# Patient Record
Sex: Female | Born: 1945
Health system: Southern US, Community
[De-identification: ages and names within clinical notes are randomized; demographics above are authoritative.]

## PROBLEM LIST (undated history)

## (undated) DIAGNOSIS — F32A Depression, unspecified: Secondary | ICD-10-CM

## (undated) DIAGNOSIS — I10 Essential (primary) hypertension: Secondary | ICD-10-CM

## (undated) DIAGNOSIS — E538 Deficiency of other specified B group vitamins: Secondary | ICD-10-CM

## (undated) DIAGNOSIS — M1990 Unspecified osteoarthritis, unspecified site: Secondary | ICD-10-CM

## (undated) DIAGNOSIS — M5090 Cervical disc disorder, unspecified, unspecified cervical region: Secondary | ICD-10-CM

## (undated) DIAGNOSIS — Q2733 Arteriovenous malformation of digestive system vessel: Secondary | ICD-10-CM

## (undated) HISTORY — PX: ABDOMINAL HYSTERECTOMY: SHX81

## (undated) HISTORY — PX: COLONOSCOPY: SHX174

## (undated) HISTORY — PX: APPENDECTOMY: SHX54

---

## 2005-03-27 ENCOUNTER — Ambulatory Visit: Payer: Self-pay | Admitting: Internal Medicine

## 2005-03-28 ENCOUNTER — Ambulatory Visit: Payer: Self-pay | Admitting: Unknown Physician Specialty

## 2005-04-09 ENCOUNTER — Ambulatory Visit: Payer: Self-pay | Admitting: Unknown Physician Specialty

## 2005-06-07 ENCOUNTER — Ambulatory Visit: Payer: Self-pay | Admitting: Unknown Physician Specialty

## 2005-08-28 ENCOUNTER — Ambulatory Visit: Payer: Self-pay | Admitting: Internal Medicine

## 2005-10-10 ENCOUNTER — Encounter: Payer: Self-pay | Admitting: Internal Medicine

## 2005-10-11 ENCOUNTER — Ambulatory Visit: Payer: Self-pay | Admitting: Internal Medicine

## 2005-10-31 ENCOUNTER — Encounter: Payer: Self-pay | Admitting: Internal Medicine

## 2005-12-19 ENCOUNTER — Ambulatory Visit: Payer: Self-pay | Admitting: Unknown Physician Specialty

## 2006-06-14 ENCOUNTER — Ambulatory Visit: Payer: Self-pay | Admitting: Internal Medicine

## 2007-06-16 ENCOUNTER — Ambulatory Visit: Payer: Self-pay | Admitting: Internal Medicine

## 2007-06-20 ENCOUNTER — Ambulatory Visit: Payer: Self-pay | Admitting: Internal Medicine

## 2008-02-25 ENCOUNTER — Ambulatory Visit: Payer: Self-pay | Admitting: Unknown Physician Specialty

## 2008-06-21 ENCOUNTER — Ambulatory Visit: Payer: Self-pay | Admitting: Internal Medicine

## 2009-06-22 ENCOUNTER — Ambulatory Visit: Payer: Self-pay | Admitting: Internal Medicine

## 2009-06-29 ENCOUNTER — Ambulatory Visit: Payer: Self-pay | Admitting: Internal Medicine

## 2010-06-27 ENCOUNTER — Ambulatory Visit: Payer: Self-pay | Admitting: Internal Medicine

## 2011-07-25 ENCOUNTER — Ambulatory Visit: Payer: Self-pay | Admitting: Internal Medicine

## 2012-07-25 ENCOUNTER — Ambulatory Visit: Payer: Self-pay | Admitting: Internal Medicine

## 2013-07-28 ENCOUNTER — Ambulatory Visit: Payer: Self-pay | Admitting: Internal Medicine

## 2013-11-16 ENCOUNTER — Ambulatory Visit: Payer: Self-pay | Admitting: Internal Medicine

## 2013-12-31 HISTORY — PX: BREAST CYST ASPIRATION: SHX578

## 2014-08-19 ENCOUNTER — Ambulatory Visit: Payer: Self-pay | Admitting: Internal Medicine

## 2014-11-01 ENCOUNTER — Ambulatory Visit: Payer: Self-pay | Admitting: Internal Medicine

## 2014-11-02 ENCOUNTER — Ambulatory Visit: Payer: Self-pay | Admitting: General Surgery

## 2015-07-25 ENCOUNTER — Other Ambulatory Visit: Payer: Self-pay | Admitting: Internal Medicine

## 2015-07-25 DIAGNOSIS — Z1231 Encounter for screening mammogram for malignant neoplasm of breast: Secondary | ICD-10-CM

## 2015-08-23 ENCOUNTER — Ambulatory Visit
Admission: RE | Admit: 2015-08-23 | Discharge: 2015-08-23 | Disposition: A | Discharge status: Home / Self Care | Payer: Medicare PPO | Source: Ambulatory Visit | Attending: Internal Medicine | Admitting: Internal Medicine

## 2015-08-23 ENCOUNTER — Other Ambulatory Visit: Payer: Self-pay | Admitting: Internal Medicine

## 2015-08-23 DIAGNOSIS — N63 Unspecified lump in breast: Secondary | ICD-10-CM | POA: Diagnosis not present

## 2015-08-23 DIAGNOSIS — N6001 Solitary cyst of right breast: Secondary | ICD-10-CM | POA: Insufficient documentation

## 2015-08-23 DIAGNOSIS — Z1231 Encounter for screening mammogram for malignant neoplasm of breast: Secondary | ICD-10-CM | POA: Diagnosis present

## 2015-08-23 DIAGNOSIS — N6002 Solitary cyst of left breast: Secondary | ICD-10-CM | POA: Diagnosis not present

## 2015-12-14 ENCOUNTER — Other Ambulatory Visit: Payer: Self-pay | Admitting: Physician Assistant

## 2015-12-14 DIAGNOSIS — R197 Diarrhea, unspecified: Secondary | ICD-10-CM

## 2015-12-14 DIAGNOSIS — R1032 Left lower quadrant pain: Secondary | ICD-10-CM

## 2015-12-21 ENCOUNTER — Ambulatory Visit
Admission: RE | Admit: 2015-12-21 | Discharge: 2015-12-21 | Disposition: A | Discharge status: Home / Self Care | Payer: Medicare PPO | Source: Ambulatory Visit | Attending: Physician Assistant | Admitting: Physician Assistant

## 2015-12-21 DIAGNOSIS — N281 Cyst of kidney, acquired: Secondary | ICD-10-CM | POA: Insufficient documentation

## 2015-12-21 DIAGNOSIS — R197 Diarrhea, unspecified: Secondary | ICD-10-CM | POA: Insufficient documentation

## 2015-12-21 DIAGNOSIS — R1032 Left lower quadrant pain: Secondary | ICD-10-CM | POA: Insufficient documentation

## 2015-12-21 DIAGNOSIS — R911 Solitary pulmonary nodule: Secondary | ICD-10-CM | POA: Insufficient documentation

## 2015-12-21 HISTORY — DX: Essential (primary) hypertension: I10

## 2015-12-21 MED ORDER — IOHEXOL 300 MG/ML  SOLN
100.0000 mL | Freq: Once | INTRAMUSCULAR | Status: AC | PRN
  Administered 2015-12-21: 100 mL via INTRAVENOUS

## 2016-02-10 ENCOUNTER — Ambulatory Visit: Payer: Medicare PPO | Admitting: Anesthesiology

## 2016-02-10 ENCOUNTER — Encounter
Admission: RE | Disposition: A | Discharge status: Home / Self Care | Payer: Self-pay | Source: Ambulatory Visit | Attending: Unknown Physician Specialty

## 2016-02-10 ENCOUNTER — Encounter: Payer: Self-pay | Admitting: *Deleted

## 2016-02-10 ENCOUNTER — Ambulatory Visit
Admission: RE | Admit: 2016-02-10 | Discharge: 2016-02-10 | Disposition: A | Discharge status: Home / Self Care | Payer: Medicare PPO | Source: Ambulatory Visit | Attending: Unknown Physician Specialty | Admitting: Unknown Physician Specialty

## 2016-02-10 DIAGNOSIS — Z811 Family history of alcohol abuse and dependence: Secondary | ICD-10-CM | POA: Insufficient documentation

## 2016-02-10 DIAGNOSIS — K640 First degree hemorrhoids: Secondary | ICD-10-CM | POA: Insufficient documentation

## 2016-02-10 DIAGNOSIS — Z87891 Personal history of nicotine dependence: Secondary | ICD-10-CM | POA: Insufficient documentation

## 2016-02-10 DIAGNOSIS — Z803 Family history of malignant neoplasm of breast: Secondary | ICD-10-CM | POA: Diagnosis not present

## 2016-02-10 DIAGNOSIS — Z79899 Other long term (current) drug therapy: Secondary | ICD-10-CM | POA: Diagnosis not present

## 2016-02-10 DIAGNOSIS — I10 Essential (primary) hypertension: Secondary | ICD-10-CM | POA: Insufficient documentation

## 2016-02-10 DIAGNOSIS — Z800 Family history of malignant neoplasm of digestive organs: Secondary | ICD-10-CM | POA: Diagnosis not present

## 2016-02-10 DIAGNOSIS — Z807 Family history of other malignant neoplasms of lymphoid, hematopoietic and related tissues: Secondary | ICD-10-CM | POA: Diagnosis not present

## 2016-02-10 DIAGNOSIS — Z90710 Acquired absence of both cervix and uterus: Secondary | ICD-10-CM | POA: Diagnosis not present

## 2016-02-10 DIAGNOSIS — Z885 Allergy status to narcotic agent status: Secondary | ICD-10-CM | POA: Insufficient documentation

## 2016-02-10 DIAGNOSIS — R197 Diarrhea, unspecified: Secondary | ICD-10-CM | POA: Insufficient documentation

## 2016-02-10 HISTORY — PX: COLONOSCOPY WITH PROPOFOL: SHX5780

## 2016-02-10 SURGERY — COLONOSCOPY WITH PROPOFOL
Anesthesia: General

## 2016-02-10 MED ORDER — MIDAZOLAM HCL 5 MG/5ML IJ SOLN
INTRAMUSCULAR | Status: DC | PRN
  Administered 2016-02-10: 1 mg via INTRAVENOUS

## 2016-02-10 MED ORDER — FENTANYL CITRATE (PF) 100 MCG/2ML IJ SOLN
INTRAMUSCULAR | Status: DC | PRN
  Administered 2016-02-10: 50 ug via INTRAVENOUS

## 2016-02-10 MED ORDER — LIDOCAINE HCL (PF) 2 % IJ SOLN
INTRAMUSCULAR | Status: DC | PRN
  Administered 2016-02-10: 60 mg

## 2016-02-10 MED ORDER — SODIUM CHLORIDE 0.9 % IV SOLN
INTRAVENOUS | Status: DC
  Administered 2016-02-10: 1000 mL via INTRAVENOUS

## 2016-02-10 MED ORDER — PROPOFOL 500 MG/50ML IV EMUL
INTRAVENOUS | Status: DC | PRN
  Administered 2016-02-10: 75 ug/kg/min via INTRAVENOUS

## 2016-02-10 MED ORDER — SODIUM CHLORIDE 0.9 % IV SOLN: INTRAVENOUS | Status: DC

## 2016-02-10 MED ORDER — PROPOFOL 10 MG/ML IV BOLUS
INTRAVENOUS | Status: DC | PRN
  Administered 2016-02-10: 50 mg via INTRAVENOUS

## 2016-02-10 NOTE — Transfer of Care (Signed)
Immediate Anesthesia Transfer of Care Note  Patient: Loretta Ramsey  Procedure(s) Performed: Procedure(s): COLONOSCOPY WITH PROPOFOL (N/A)  Patient Location: PACU  Anesthesia Type:General  Level of Consciousness: sedated  Airway & Oxygen Therapy: Patient Spontanous Breathing and Patient connected to nasal cannula oxygen  Post-op Assessment: Report given to RN and Post -op Vital signs reviewed and stable  Post vital signs: Reviewed and stable  Last Vitals:  Filed Vitals:   02/10/16 1238  BP: 128/68  Pulse: 80  Temp: 37.2 C  Resp: 20    Complications: No apparent anesthesia complications

## 2016-02-10 NOTE — Anesthesia Preprocedure Evaluation (Signed)
Anesthesia Evaluation  Patient identified by MRN, date of birth, ID band Patient awake    Reviewed: Allergy & Precautions, H&P , NPO status , Patient's Chart, lab work & pertinent test results, reviewed documented beta blocker date and time   Airway Mallampati: II   Neck ROM: full    Dental  (+) Poor Dentition   Pulmonary neg pulmonary ROS, former smoker,    Pulmonary exam normal        Cardiovascular hypertension, negative cardio ROS Normal cardiovascular exam     Neuro/Psych negative neurological ROS  negative psych ROS   GI/Hepatic negative GI ROS, Neg liver ROS,   Endo/Other  negative endocrine ROS  Renal/GU negative Renal ROS  negative genitourinary   Musculoskeletal   Abdominal   Peds  Hematology negative hematology ROS (+)   Anesthesia Other Findings Past Medical History:   Hypertension                                               Past Surgical History:   BREAST CYST ASPIRATION                          Left 2015       BMI    Body Mass Index   23.41 kg/m 2     Reproductive/Obstetrics                             Anesthesia Physical Anesthesia Plan  ASA: II  Anesthesia Plan: General   Post-op Pain Management:    Induction:   Airway Management Planned:   Additional Equipment:   Intra-op Plan:   Post-operative Plan:   Informed Consent: I have reviewed the patients History and Physical, chart, labs and discussed the procedure including the risks, benefits and alternatives for the proposed anesthesia with the patient or authorized representative who has indicated his/her understanding and acceptance.   Dental Advisory Given  Plan Discussed with: CRNA  Anesthesia Plan Comments:         Anesthesia Quick Evaluation

## 2016-02-10 NOTE — Op Note (Signed)
[facility] Gastroenterology Patient Name: Loretta Ramsey Procedure Date: 02/10/2016 1:24 PM MRN: [id number] Account #: [id number] Date of Birth: 11-Oct-1946 Admit Type: Outpatient Age: 70 Room: [facility] ENDO ROOM 1 Gender: Female Note Status: Finalized Procedure:         Colonoscopy Indications:       Diarrhea Providers:         Manya Silvas, MD Referring MD:      Rusty Aus, MD (Referring MD) Medicines:         Propofol per Anesthesia Complications:     No immediate complications. Procedure:         Pre-Anesthesia Assessment:                    - After reviewing the risks and benefits, the patient was                     deemed in satisfactory condition to undergo the procedure.                    After obtaining informed consent, the colonoscope was                     passed under direct vision. Throughout the procedure, the                     patient's blood pressure, pulse, and oxygen saturations                     were monitored continuously. The Colonoscope was                     introduced through the anus and advanced to the the cecum,                     identified by appendiceal orifice and ileocecal valve. The                     colonoscopy was performed without difficulty. The patient                     tolerated the procedure well. The quality of the bowel                     preparation was excellent. Findings:      Internal hemorrhoids were found during endoscopy. The hemorrhoids were       small and Grade I (internal hemorrhoids that do not prolapse).      The exam was otherwise without abnormality. Impression:        - Internal hemorrhoids.                    - The examination was otherwise normal.                    - No specimens collected. Recommendation:    - Repeat colonoscopy in 5 years for surveillance. Manya Silvas, MD 02/10/2016 1:54:13 PM This report has been signed electronically. Number of Addenda: 0 Note Initiated  On: 02/10/2016 1:24 PM Scope Withdrawal Time: 0 hours 8 minutes 48 seconds  Total Procedure Duration: 0 hours 15 minutes 44 seconds       [facility]

## 2016-02-10 NOTE — H&P (Signed)
   Primary Care Physician:  Rusty Aus., MD Primary Gastroenterologist:  Dr. Vira Agar  Pre-Procedure History & Physical: HPI:  Loretta Ramsey is a 70 y.o. female is here for an colonoscopy.   Past Medical History  Diagnosis Date  . Hypertension     Past Surgical History  Procedure Laterality Date  . Breast cyst aspiration Left 2015    Prior to Admission medications   Medication Sig Start Date End Date Taking? Authorizing Provider  buPROPion (WELLBUTRIN XL) 300 MG 24 hr tablet Take 300 mg by mouth daily.   Yes Historical Provider, MD  estrogens, conjugated, (PREMARIN) 0.45 MG tablet Take 0.45 mg by mouth daily. Take daily for 21 days then do not take for 7 days.   Yes Historical Provider, MD  lisinopril-hydrochlorothiazide (PRINZIDE,ZESTORETIC) 10-12.5 MG tablet Take 1 tablet by mouth daily.   Yes Historical Provider, MD  pantoprazole (PROTONIX) 40 MG tablet Take 40 mg by mouth daily.   Yes Historical Provider, MD  tolterodine (DETROL) 2 MG tablet Take 2 mg by mouth 2 (two) times daily.   Yes Historical Provider, MD  traZODone (DESYREL) 50 MG tablet Take 50 mg by mouth at bedtime.   Yes Historical Provider, MD  valACYclovir (VALTREX) 500 MG tablet Take 500 mg by mouth 2 (two) times daily.   Yes Historical Provider, MD  vitamin B-12 (CYANOCOBALAMIN) 1000 MCG tablet Take 1,000 mcg by mouth daily.   Yes Historical Provider, MD    Allergies as of 02/06/2016 - Review Complete 12/21/2015  Allergen Reaction Noted  . Codeine Nausea Only 12/21/2015    Family History  Problem Relation Age of Onset  . Breast cancer Maternal Aunt     Social History   Social History  . Marital Status: Married    Spouse Name: N/A  . Number of Children: N/A  . Years of Education: N/A   Occupational History  . Not on file.   Social History Main Topics  . Smoking status: Former Research scientist (life sciences)  . Smokeless tobacco: Not on file  . Alcohol Use: Yes  . Drug Use: No  . Sexual Activity: Not on file    Other Topics Concern  . Not on file   Social History Narrative    Review of Systems: See HPI, otherwise negative ROS  Physical Exam: BP 128/68 mmHg  Pulse 80  Temp(Src) 98.9 F (37.2 C)  Resp 20  Ht 5\' 6"  (1.676 m)  Wt 65.772 kg (145 lb)  BMI 23.41 kg/m2  SpO2 100% General:   Alert,  pleasant and cooperative in NAD Head:  Normocephalic and atraumatic. Neck:  Supple; no masses or thyromegaly. Lungs:  Clear throughout to auscultation.    Heart:  Regular rate and rhythm. Abdomen:  Soft, nontender and nondistended. Normal bowel sounds, without guarding, and without rebound.   Neurologic:  Alert and  oriented x4;  grossly normal neurologically.  Impression/Plan: Loretta Ramsey is here for an colonoscopy to be performed for diarrhea, change in bowel habits  Risks, benefits, limitations, and alternatives regarding  colonoscopy have been reviewed with the patient.  Questions have been answered.  All parties agreeable.   Gaylyn Cheers, MD  02/10/2016, 1:21 PM

## 2016-02-11 ENCOUNTER — Encounter: Payer: Self-pay | Admitting: Unknown Physician Specialty

## 2016-02-13 NOTE — Anesthesia Postprocedure Evaluation (Signed)
Anesthesia Post Note  Patient: Loretta Ramsey  Procedure(s) Performed: Procedure(s) (LRB): COLONOSCOPY WITH PROPOFOL (N/A)  Patient location during evaluation: PACU Anesthesia Type: General Level of consciousness: awake and alert Pain management: pain level controlled Vital Signs Assessment: post-procedure vital signs reviewed and stable Respiratory status: spontaneous breathing, nonlabored ventilation, respiratory function stable and patient connected to nasal cannula oxygen Cardiovascular status: blood pressure returned to baseline and stable Postop Assessment: no signs of nausea or vomiting Anesthetic complications: no    Last Vitals:  Filed Vitals:   02/10/16 1416 02/10/16 1426  BP: 128/64 132/65  Pulse: 67 63  Temp:    Resp: 19 12    Last Pain: There were no vitals filed for this visit.               Molli Barrows

## 2016-07-16 ENCOUNTER — Other Ambulatory Visit: Payer: Self-pay | Admitting: Internal Medicine

## 2016-07-16 DIAGNOSIS — Z1231 Encounter for screening mammogram for malignant neoplasm of breast: Secondary | ICD-10-CM

## 2016-08-23 ENCOUNTER — Other Ambulatory Visit: Payer: Self-pay | Admitting: Internal Medicine

## 2016-08-23 ENCOUNTER — Ambulatory Visit
Admission: RE | Admit: 2016-08-23 | Discharge: 2016-08-23 | Disposition: A | Discharge status: Home / Self Care | Payer: Medicare PPO | Source: Ambulatory Visit | Attending: Internal Medicine | Admitting: Internal Medicine

## 2016-08-23 DIAGNOSIS — Z1231 Encounter for screening mammogram for malignant neoplasm of breast: Secondary | ICD-10-CM

## 2017-08-12 ENCOUNTER — Other Ambulatory Visit: Payer: Self-pay | Admitting: Internal Medicine

## 2017-08-12 DIAGNOSIS — Z1231 Encounter for screening mammogram for malignant neoplasm of breast: Secondary | ICD-10-CM

## 2017-09-05 ENCOUNTER — Ambulatory Visit
Admission: RE | Admit: 2017-09-05 | Discharge: 2017-09-05 | Disposition: A | Discharge status: Home / Self Care | Payer: Medicare Other | Source: Ambulatory Visit | Attending: Internal Medicine | Admitting: Internal Medicine

## 2017-09-05 DIAGNOSIS — Z1231 Encounter for screening mammogram for malignant neoplasm of breast: Secondary | ICD-10-CM

## 2018-06-20 ENCOUNTER — Other Ambulatory Visit: Payer: Self-pay | Admitting: Physician Assistant

## 2018-06-20 ENCOUNTER — Ambulatory Visit
Admission: RE | Admit: 2018-06-20 | Discharge: 2018-06-20 | Disposition: A | Discharge status: Home / Self Care | Payer: Medicare Other | Source: Ambulatory Visit | Attending: Physician Assistant | Admitting: Physician Assistant

## 2018-06-20 DIAGNOSIS — G44311 Acute post-traumatic headache, intractable: Secondary | ICD-10-CM

## 2018-06-20 DIAGNOSIS — X58XXXA Exposure to other specified factors, initial encounter: Secondary | ICD-10-CM | POA: Insufficient documentation

## 2018-06-20 DIAGNOSIS — S0083XA Contusion of other part of head, initial encounter: Secondary | ICD-10-CM | POA: Diagnosis not present

## 2018-06-20 DIAGNOSIS — H05232 Hemorrhage of left orbit: Secondary | ICD-10-CM | POA: Diagnosis not present

## 2018-06-20 DIAGNOSIS — R519 Headache, unspecified: Secondary | ICD-10-CM

## 2018-06-20 DIAGNOSIS — R51 Headache: Secondary | ICD-10-CM

## 2018-06-20 DIAGNOSIS — W19XXXA Unspecified fall, initial encounter: Secondary | ICD-10-CM

## 2018-06-20 DIAGNOSIS — H5789 Other specified disorders of eye and adnexa: Secondary | ICD-10-CM

## 2018-06-20 DIAGNOSIS — S0990XA Unspecified injury of head, initial encounter: Secondary | ICD-10-CM

## 2018-09-04 ENCOUNTER — Other Ambulatory Visit: Payer: Self-pay | Admitting: Internal Medicine

## 2018-09-04 DIAGNOSIS — Z1231 Encounter for screening mammogram for malignant neoplasm of breast: Secondary | ICD-10-CM

## 2018-09-19 ENCOUNTER — Ambulatory Visit
Admission: RE | Admit: 2018-09-19 | Discharge: 2018-09-19 | Disposition: A | Discharge status: Home / Self Care | Payer: Medicare Other | Source: Ambulatory Visit | Attending: Internal Medicine | Admitting: Internal Medicine

## 2018-09-19 DIAGNOSIS — Z1231 Encounter for screening mammogram for malignant neoplasm of breast: Secondary | ICD-10-CM | POA: Diagnosis present

## 2019-01-01 DIAGNOSIS — E538 Deficiency of other specified B group vitamins: Secondary | ICD-10-CM | POA: Diagnosis not present

## 2019-01-06 DIAGNOSIS — M5412 Radiculopathy, cervical region: Secondary | ICD-10-CM | POA: Diagnosis not present

## 2019-01-07 DIAGNOSIS — M62830 Muscle spasm of back: Secondary | ICD-10-CM | POA: Diagnosis not present

## 2019-01-07 DIAGNOSIS — M9901 Segmental and somatic dysfunction of cervical region: Secondary | ICD-10-CM | POA: Diagnosis not present

## 2019-01-07 DIAGNOSIS — M5412 Radiculopathy, cervical region: Secondary | ICD-10-CM | POA: Diagnosis not present

## 2019-01-07 DIAGNOSIS — M9902 Segmental and somatic dysfunction of thoracic region: Secondary | ICD-10-CM | POA: Diagnosis not present

## 2019-02-02 DIAGNOSIS — E538 Deficiency of other specified B group vitamins: Secondary | ICD-10-CM | POA: Diagnosis not present

## 2019-02-04 DIAGNOSIS — M62830 Muscle spasm of back: Secondary | ICD-10-CM | POA: Diagnosis not present

## 2019-02-04 DIAGNOSIS — M9901 Segmental and somatic dysfunction of cervical region: Secondary | ICD-10-CM | POA: Diagnosis not present

## 2019-02-04 DIAGNOSIS — M9902 Segmental and somatic dysfunction of thoracic region: Secondary | ICD-10-CM | POA: Diagnosis not present

## 2019-02-04 DIAGNOSIS — M5412 Radiculopathy, cervical region: Secondary | ICD-10-CM | POA: Diagnosis not present

## 2019-03-02 DIAGNOSIS — H26493 Other secondary cataract, bilateral: Secondary | ICD-10-CM | POA: Diagnosis not present

## 2019-03-04 DIAGNOSIS — M9902 Segmental and somatic dysfunction of thoracic region: Secondary | ICD-10-CM | POA: Diagnosis not present

## 2019-03-04 DIAGNOSIS — M9901 Segmental and somatic dysfunction of cervical region: Secondary | ICD-10-CM | POA: Diagnosis not present

## 2019-03-04 DIAGNOSIS — M5412 Radiculopathy, cervical region: Secondary | ICD-10-CM | POA: Diagnosis not present

## 2019-03-04 DIAGNOSIS — M62830 Muscle spasm of back: Secondary | ICD-10-CM | POA: Diagnosis not present

## 2019-03-06 DIAGNOSIS — E538 Deficiency of other specified B group vitamins: Secondary | ICD-10-CM | POA: Diagnosis not present

## 2019-04-13 ENCOUNTER — Other Ambulatory Visit: Payer: Self-pay | Admitting: Internal Medicine

## 2019-04-13 ENCOUNTER — Other Ambulatory Visit: Payer: Self-pay

## 2019-04-13 ENCOUNTER — Ambulatory Visit
Admission: RE | Admit: 2019-04-13 | Discharge: 2019-04-13 | Disposition: A | Discharge status: Home / Self Care | Payer: PPO | Source: Ambulatory Visit | Attending: Internal Medicine | Admitting: Internal Medicine

## 2019-04-13 DIAGNOSIS — K625 Hemorrhage of anus and rectum: Secondary | ICD-10-CM | POA: Diagnosis not present

## 2019-04-13 DIAGNOSIS — Z0000 Encounter for general adult medical examination without abnormal findings: Secondary | ICD-10-CM | POA: Diagnosis not present

## 2019-04-13 DIAGNOSIS — E538 Deficiency of other specified B group vitamins: Secondary | ICD-10-CM | POA: Diagnosis not present

## 2019-04-13 DIAGNOSIS — R1084 Generalized abdominal pain: Secondary | ICD-10-CM | POA: Diagnosis not present

## 2019-04-13 DIAGNOSIS — R1031 Right lower quadrant pain: Secondary | ICD-10-CM | POA: Diagnosis not present

## 2019-04-13 DIAGNOSIS — K5909 Other constipation: Secondary | ICD-10-CM | POA: Diagnosis not present

## 2019-04-13 LAB — POCT I-STAT CREATININE: Creatinine, Ser: 0.9 mg/dL (ref 0.44–1.00)

## 2019-04-13 MED ORDER — IOHEXOL 300 MG/ML  SOLN
100.0000 mL | Freq: Once | INTRAMUSCULAR | Status: AC | PRN
  Administered 2019-04-13: 18:00:00 100 mL via INTRAVENOUS

## 2019-04-14 ENCOUNTER — Other Ambulatory Visit: Payer: Self-pay | Admitting: Internal Medicine

## 2019-04-14 ENCOUNTER — Ambulatory Visit
Admission: RE | Admit: 2019-04-14 | Discharge: 2019-04-14 | Disposition: A | Discharge status: Home / Self Care | Payer: PPO | Source: Ambulatory Visit | Attending: Internal Medicine | Admitting: Internal Medicine

## 2019-04-14 DIAGNOSIS — K769 Liver disease, unspecified: Secondary | ICD-10-CM | POA: Insufficient documentation

## 2019-04-14 DIAGNOSIS — N281 Cyst of kidney, acquired: Secondary | ICD-10-CM | POA: Diagnosis not present

## 2019-04-14 DIAGNOSIS — K7689 Other specified diseases of liver: Secondary | ICD-10-CM | POA: Insufficient documentation

## 2019-04-14 MED ORDER — GADOBUTROL 1 MMOL/ML IV SOLN
6.0000 mL | Freq: Once | INTRAVENOUS | Status: AC | PRN
  Administered 2019-04-14: 6 mL via INTRAVENOUS

## 2019-04-15 DIAGNOSIS — M62830 Muscle spasm of back: Secondary | ICD-10-CM | POA: Diagnosis not present

## 2019-04-15 DIAGNOSIS — M9902 Segmental and somatic dysfunction of thoracic region: Secondary | ICD-10-CM | POA: Diagnosis not present

## 2019-04-15 DIAGNOSIS — M9901 Segmental and somatic dysfunction of cervical region: Secondary | ICD-10-CM | POA: Diagnosis not present

## 2019-04-15 DIAGNOSIS — M5412 Radiculopathy, cervical region: Secondary | ICD-10-CM | POA: Diagnosis not present

## 2019-05-06 DIAGNOSIS — M9901 Segmental and somatic dysfunction of cervical region: Secondary | ICD-10-CM | POA: Diagnosis not present

## 2019-05-06 DIAGNOSIS — M62830 Muscle spasm of back: Secondary | ICD-10-CM | POA: Diagnosis not present

## 2019-05-06 DIAGNOSIS — M9902 Segmental and somatic dysfunction of thoracic region: Secondary | ICD-10-CM | POA: Diagnosis not present

## 2019-05-06 DIAGNOSIS — M5412 Radiculopathy, cervical region: Secondary | ICD-10-CM | POA: Diagnosis not present

## 2019-05-19 DIAGNOSIS — E538 Deficiency of other specified B group vitamins: Secondary | ICD-10-CM | POA: Diagnosis not present

## 2019-05-20 DIAGNOSIS — M62830 Muscle spasm of back: Secondary | ICD-10-CM | POA: Diagnosis not present

## 2019-05-20 DIAGNOSIS — M9901 Segmental and somatic dysfunction of cervical region: Secondary | ICD-10-CM | POA: Diagnosis not present

## 2019-05-20 DIAGNOSIS — M5412 Radiculopathy, cervical region: Secondary | ICD-10-CM | POA: Diagnosis not present

## 2019-05-20 DIAGNOSIS — M9902 Segmental and somatic dysfunction of thoracic region: Secondary | ICD-10-CM | POA: Diagnosis not present

## 2019-06-04 DIAGNOSIS — H6982 Other specified disorders of Eustachian tube, left ear: Secondary | ICD-10-CM | POA: Diagnosis not present

## 2019-06-19 DIAGNOSIS — E538 Deficiency of other specified B group vitamins: Secondary | ICD-10-CM | POA: Diagnosis not present

## 2019-06-22 DIAGNOSIS — M9902 Segmental and somatic dysfunction of thoracic region: Secondary | ICD-10-CM | POA: Diagnosis not present

## 2019-06-22 DIAGNOSIS — M9901 Segmental and somatic dysfunction of cervical region: Secondary | ICD-10-CM | POA: Diagnosis not present

## 2019-06-22 DIAGNOSIS — M62830 Muscle spasm of back: Secondary | ICD-10-CM | POA: Diagnosis not present

## 2019-06-22 DIAGNOSIS — M5412 Radiculopathy, cervical region: Secondary | ICD-10-CM | POA: Diagnosis not present

## 2019-07-01 DIAGNOSIS — M9901 Segmental and somatic dysfunction of cervical region: Secondary | ICD-10-CM | POA: Diagnosis not present

## 2019-07-01 DIAGNOSIS — M62830 Muscle spasm of back: Secondary | ICD-10-CM | POA: Diagnosis not present

## 2019-07-01 DIAGNOSIS — M5412 Radiculopathy, cervical region: Secondary | ICD-10-CM | POA: Diagnosis not present

## 2019-07-01 DIAGNOSIS — M9902 Segmental and somatic dysfunction of thoracic region: Secondary | ICD-10-CM | POA: Diagnosis not present

## 2019-07-27 DIAGNOSIS — M9902 Segmental and somatic dysfunction of thoracic region: Secondary | ICD-10-CM | POA: Diagnosis not present

## 2019-07-27 DIAGNOSIS — M9901 Segmental and somatic dysfunction of cervical region: Secondary | ICD-10-CM | POA: Diagnosis not present

## 2019-07-27 DIAGNOSIS — M62830 Muscle spasm of back: Secondary | ICD-10-CM | POA: Diagnosis not present

## 2019-07-27 DIAGNOSIS — M5412 Radiculopathy, cervical region: Secondary | ICD-10-CM | POA: Diagnosis not present

## 2019-07-27 DIAGNOSIS — E538 Deficiency of other specified B group vitamins: Secondary | ICD-10-CM | POA: Diagnosis not present

## 2019-08-10 DIAGNOSIS — M5412 Radiculopathy, cervical region: Secondary | ICD-10-CM | POA: Diagnosis not present

## 2019-08-10 DIAGNOSIS — M62830 Muscle spasm of back: Secondary | ICD-10-CM | POA: Diagnosis not present

## 2019-08-10 DIAGNOSIS — M9902 Segmental and somatic dysfunction of thoracic region: Secondary | ICD-10-CM | POA: Diagnosis not present

## 2019-08-10 DIAGNOSIS — M9901 Segmental and somatic dysfunction of cervical region: Secondary | ICD-10-CM | POA: Diagnosis not present

## 2019-08-18 ENCOUNTER — Other Ambulatory Visit: Payer: Self-pay | Admitting: Internal Medicine

## 2019-08-18 DIAGNOSIS — Z1231 Encounter for screening mammogram for malignant neoplasm of breast: Secondary | ICD-10-CM

## 2019-08-19 DIAGNOSIS — M25562 Pain in left knee: Secondary | ICD-10-CM | POA: Diagnosis not present

## 2019-08-19 DIAGNOSIS — M5010 Cervical disc disorder with radiculopathy, unspecified cervical region: Secondary | ICD-10-CM | POA: Diagnosis not present

## 2019-08-19 DIAGNOSIS — I10 Essential (primary) hypertension: Secondary | ICD-10-CM | POA: Diagnosis not present

## 2019-08-21 DIAGNOSIS — E782 Mixed hyperlipidemia: Secondary | ICD-10-CM | POA: Diagnosis not present

## 2019-08-21 DIAGNOSIS — Z79899 Other long term (current) drug therapy: Secondary | ICD-10-CM | POA: Diagnosis not present

## 2019-08-21 DIAGNOSIS — E538 Deficiency of other specified B group vitamins: Secondary | ICD-10-CM | POA: Diagnosis not present

## 2019-08-27 DIAGNOSIS — E538 Deficiency of other specified B group vitamins: Secondary | ICD-10-CM | POA: Diagnosis not present

## 2019-08-31 DIAGNOSIS — K5909 Other constipation: Secondary | ICD-10-CM | POA: Diagnosis not present

## 2019-09-02 DIAGNOSIS — E782 Mixed hyperlipidemia: Secondary | ICD-10-CM | POA: Diagnosis not present

## 2019-09-02 DIAGNOSIS — Z0000 Encounter for general adult medical examination without abnormal findings: Secondary | ICD-10-CM | POA: Diagnosis not present

## 2019-09-02 DIAGNOSIS — E538 Deficiency of other specified B group vitamins: Secondary | ICD-10-CM | POA: Diagnosis not present

## 2019-09-02 DIAGNOSIS — M9901 Segmental and somatic dysfunction of cervical region: Secondary | ICD-10-CM | POA: Diagnosis not present

## 2019-09-02 DIAGNOSIS — M9902 Segmental and somatic dysfunction of thoracic region: Secondary | ICD-10-CM | POA: Diagnosis not present

## 2019-09-02 DIAGNOSIS — M62830 Muscle spasm of back: Secondary | ICD-10-CM | POA: Diagnosis not present

## 2019-09-02 DIAGNOSIS — M5412 Radiculopathy, cervical region: Secondary | ICD-10-CM | POA: Diagnosis not present

## 2019-09-21 ENCOUNTER — Ambulatory Visit
Admission: RE | Admit: 2019-09-21 | Discharge: 2019-09-21 | Disposition: A | Discharge status: Home / Self Care | Payer: PPO | Source: Ambulatory Visit | Attending: Internal Medicine | Admitting: Internal Medicine

## 2019-09-21 DIAGNOSIS — Z1231 Encounter for screening mammogram for malignant neoplasm of breast: Secondary | ICD-10-CM | POA: Diagnosis not present

## 2019-09-28 DIAGNOSIS — E538 Deficiency of other specified B group vitamins: Secondary | ICD-10-CM | POA: Diagnosis not present

## 2019-09-28 DIAGNOSIS — Z23 Encounter for immunization: Secondary | ICD-10-CM | POA: Diagnosis not present

## 2019-10-05 DIAGNOSIS — H26493 Other secondary cataract, bilateral: Secondary | ICD-10-CM | POA: Diagnosis not present

## 2019-10-07 DIAGNOSIS — M62830 Muscle spasm of back: Secondary | ICD-10-CM | POA: Diagnosis not present

## 2019-10-07 DIAGNOSIS — M9902 Segmental and somatic dysfunction of thoracic region: Secondary | ICD-10-CM | POA: Diagnosis not present

## 2019-10-07 DIAGNOSIS — M5412 Radiculopathy, cervical region: Secondary | ICD-10-CM | POA: Diagnosis not present

## 2019-10-07 DIAGNOSIS — M9901 Segmental and somatic dysfunction of cervical region: Secondary | ICD-10-CM | POA: Diagnosis not present

## 2019-10-22 DIAGNOSIS — S41111A Laceration without foreign body of right upper arm, initial encounter: Secondary | ICD-10-CM | POA: Diagnosis not present

## 2019-10-22 DIAGNOSIS — W010XXA Fall on same level from slipping, tripping and stumbling without subsequent striking against object, initial encounter: Secondary | ICD-10-CM | POA: Diagnosis not present

## 2019-10-29 DIAGNOSIS — E538 Deficiency of other specified B group vitamins: Secondary | ICD-10-CM | POA: Diagnosis not present

## 2019-11-04 DIAGNOSIS — M9901 Segmental and somatic dysfunction of cervical region: Secondary | ICD-10-CM | POA: Diagnosis not present

## 2019-11-04 DIAGNOSIS — M9902 Segmental and somatic dysfunction of thoracic region: Secondary | ICD-10-CM | POA: Diagnosis not present

## 2019-11-04 DIAGNOSIS — M62830 Muscle spasm of back: Secondary | ICD-10-CM | POA: Diagnosis not present

## 2019-11-04 DIAGNOSIS — M5412 Radiculopathy, cervical region: Secondary | ICD-10-CM | POA: Diagnosis not present

## 2019-11-18 DIAGNOSIS — M62830 Muscle spasm of back: Secondary | ICD-10-CM | POA: Diagnosis not present

## 2019-11-18 DIAGNOSIS — M9901 Segmental and somatic dysfunction of cervical region: Secondary | ICD-10-CM | POA: Diagnosis not present

## 2019-11-18 DIAGNOSIS — M5412 Radiculopathy, cervical region: Secondary | ICD-10-CM | POA: Diagnosis not present

## 2019-11-18 DIAGNOSIS — M9902 Segmental and somatic dysfunction of thoracic region: Secondary | ICD-10-CM | POA: Diagnosis not present

## 2019-11-30 DIAGNOSIS — E538 Deficiency of other specified B group vitamins: Secondary | ICD-10-CM | POA: Diagnosis not present

## 2019-12-02 DIAGNOSIS — M9902 Segmental and somatic dysfunction of thoracic region: Secondary | ICD-10-CM | POA: Diagnosis not present

## 2019-12-02 DIAGNOSIS — M5412 Radiculopathy, cervical region: Secondary | ICD-10-CM | POA: Diagnosis not present

## 2019-12-02 DIAGNOSIS — M62830 Muscle spasm of back: Secondary | ICD-10-CM | POA: Diagnosis not present

## 2019-12-02 DIAGNOSIS — M9901 Segmental and somatic dysfunction of cervical region: Secondary | ICD-10-CM | POA: Diagnosis not present

## 2019-12-28 DIAGNOSIS — M9902 Segmental and somatic dysfunction of thoracic region: Secondary | ICD-10-CM | POA: Diagnosis not present

## 2019-12-28 DIAGNOSIS — M62830 Muscle spasm of back: Secondary | ICD-10-CM | POA: Diagnosis not present

## 2019-12-28 DIAGNOSIS — M5412 Radiculopathy, cervical region: Secondary | ICD-10-CM | POA: Diagnosis not present

## 2019-12-28 DIAGNOSIS — M9901 Segmental and somatic dysfunction of cervical region: Secondary | ICD-10-CM | POA: Diagnosis not present

## 2019-12-31 DIAGNOSIS — E538 Deficiency of other specified B group vitamins: Secondary | ICD-10-CM | POA: Diagnosis not present

## 2020-01-06 DIAGNOSIS — M9902 Segmental and somatic dysfunction of thoracic region: Secondary | ICD-10-CM | POA: Diagnosis not present

## 2020-01-06 DIAGNOSIS — M5412 Radiculopathy, cervical region: Secondary | ICD-10-CM | POA: Diagnosis not present

## 2020-01-06 DIAGNOSIS — M9901 Segmental and somatic dysfunction of cervical region: Secondary | ICD-10-CM | POA: Diagnosis not present

## 2020-01-06 DIAGNOSIS — M62830 Muscle spasm of back: Secondary | ICD-10-CM | POA: Diagnosis not present

## 2020-01-26 DIAGNOSIS — M9902 Segmental and somatic dysfunction of thoracic region: Secondary | ICD-10-CM | POA: Diagnosis not present

## 2020-01-26 DIAGNOSIS — M62830 Muscle spasm of back: Secondary | ICD-10-CM | POA: Diagnosis not present

## 2020-01-26 DIAGNOSIS — M9901 Segmental and somatic dysfunction of cervical region: Secondary | ICD-10-CM | POA: Diagnosis not present

## 2020-01-26 DIAGNOSIS — M5412 Radiculopathy, cervical region: Secondary | ICD-10-CM | POA: Diagnosis not present

## 2020-02-01 DIAGNOSIS — E538 Deficiency of other specified B group vitamins: Secondary | ICD-10-CM | POA: Diagnosis not present

## 2020-02-03 DIAGNOSIS — D2262 Melanocytic nevi of left upper limb, including shoulder: Secondary | ICD-10-CM | POA: Diagnosis not present

## 2020-02-03 DIAGNOSIS — D2272 Melanocytic nevi of left lower limb, including hip: Secondary | ICD-10-CM | POA: Diagnosis not present

## 2020-02-03 DIAGNOSIS — D2271 Melanocytic nevi of right lower limb, including hip: Secondary | ICD-10-CM | POA: Diagnosis not present

## 2020-02-03 DIAGNOSIS — G9009 Other idiopathic peripheral autonomic neuropathy: Secondary | ICD-10-CM | POA: Diagnosis not present

## 2020-02-03 DIAGNOSIS — D2261 Melanocytic nevi of right upper limb, including shoulder: Secondary | ICD-10-CM | POA: Diagnosis not present

## 2020-02-03 DIAGNOSIS — D225 Melanocytic nevi of trunk: Secondary | ICD-10-CM | POA: Diagnosis not present

## 2020-02-09 DIAGNOSIS — M5412 Radiculopathy, cervical region: Secondary | ICD-10-CM | POA: Diagnosis not present

## 2020-02-09 DIAGNOSIS — M9901 Segmental and somatic dysfunction of cervical region: Secondary | ICD-10-CM | POA: Diagnosis not present

## 2020-02-09 DIAGNOSIS — M62830 Muscle spasm of back: Secondary | ICD-10-CM | POA: Diagnosis not present

## 2020-02-09 DIAGNOSIS — M9902 Segmental and somatic dysfunction of thoracic region: Secondary | ICD-10-CM | POA: Diagnosis not present

## 2020-02-14 ENCOUNTER — Ambulatory Visit: Payer: PPO | Attending: Internal Medicine

## 2020-02-14 DIAGNOSIS — Z23 Encounter for immunization: Secondary | ICD-10-CM | POA: Insufficient documentation

## 2020-02-14 NOTE — Progress Notes (Signed)
   Covid-19 Vaccination Clinic  Name:  Loretta Ramsey    MRN: [id number] DOB: 06-Sep-1946  02/14/2020  [name] was observed post Covid-19 immunization for 15 minutes without incidence. She was provided with Vaccine Information Sheet and instruction to access the V-Safe system.   [name] was instructed to call 911 with any severe reactions post vaccine: Marland Kitchen Difficulty breathing  . Swelling of your face and throat  . A fast heartbeat  . A bad rash all over your body  . Dizziness and weakness    Immunizations Administered    Name Date Dose VIS Date Route   Pfizer COVID-19 Vaccine 02/14/2020  9:03 AM 0.3 mL 12/11/2019 Intramuscular   Manufacturer: [place]   Lot: [id number]   [place]: [id number]

## 2020-03-02 DIAGNOSIS — M9901 Segmental and somatic dysfunction of cervical region: Secondary | ICD-10-CM | POA: Diagnosis not present

## 2020-03-02 DIAGNOSIS — M5412 Radiculopathy, cervical region: Secondary | ICD-10-CM | POA: Diagnosis not present

## 2020-03-02 DIAGNOSIS — M9902 Segmental and somatic dysfunction of thoracic region: Secondary | ICD-10-CM | POA: Diagnosis not present

## 2020-03-02 DIAGNOSIS — M62830 Muscle spasm of back: Secondary | ICD-10-CM | POA: Diagnosis not present

## 2020-03-03 DIAGNOSIS — E538 Deficiency of other specified B group vitamins: Secondary | ICD-10-CM | POA: Diagnosis not present

## 2020-03-09 ENCOUNTER — Ambulatory Visit: Payer: PPO | Attending: Internal Medicine

## 2020-03-09 DIAGNOSIS — Z23 Encounter for immunization: Secondary | ICD-10-CM

## 2020-03-09 NOTE — Progress Notes (Signed)
   Covid-19 Vaccination Clinic  Name:  Loretta Ramsey    MRN: [id number] DOB: 1946-05-14  03/09/2020  Loretta Ramsey was observed post Covid-19 immunization for 15 minutes without incident. She was provided with Vaccine Information Sheet and instruction to access the V-Safe system.   Loretta Ramsey was instructed to call 911 with any severe reactions post vaccine: Marland Kitchen Difficulty breathing  . Swelling of face and throat  . A fast heartbeat  . A bad rash all over body  . Dizziness and weakness   Immunizations Administered    Name Date Dose VIS Date Route   Pfizer COVID-19 Vaccine 03/09/2020  1:37 PM 0.3 mL 12/11/2019 Intramuscular   Manufacturer: [place]   Lot: [id number]   [place]: [id number]

## 2020-03-16 DIAGNOSIS — M9902 Segmental and somatic dysfunction of thoracic region: Secondary | ICD-10-CM | POA: Diagnosis not present

## 2020-03-16 DIAGNOSIS — M5412 Radiculopathy, cervical region: Secondary | ICD-10-CM | POA: Diagnosis not present

## 2020-03-16 DIAGNOSIS — M62830 Muscle spasm of back: Secondary | ICD-10-CM | POA: Diagnosis not present

## 2020-03-16 DIAGNOSIS — M9901 Segmental and somatic dysfunction of cervical region: Secondary | ICD-10-CM | POA: Diagnosis not present

## 2020-04-04 DIAGNOSIS — E538 Deficiency of other specified B group vitamins: Secondary | ICD-10-CM | POA: Diagnosis not present

## 2020-04-19 DIAGNOSIS — M9901 Segmental and somatic dysfunction of cervical region: Secondary | ICD-10-CM | POA: Diagnosis not present

## 2020-04-19 DIAGNOSIS — M62830 Muscle spasm of back: Secondary | ICD-10-CM | POA: Diagnosis not present

## 2020-04-19 DIAGNOSIS — M9902 Segmental and somatic dysfunction of thoracic region: Secondary | ICD-10-CM | POA: Diagnosis not present

## 2020-04-19 DIAGNOSIS — M5412 Radiculopathy, cervical region: Secondary | ICD-10-CM | POA: Diagnosis not present

## 2020-05-04 DIAGNOSIS — M9902 Segmental and somatic dysfunction of thoracic region: Secondary | ICD-10-CM | POA: Diagnosis not present

## 2020-05-04 DIAGNOSIS — M9901 Segmental and somatic dysfunction of cervical region: Secondary | ICD-10-CM | POA: Diagnosis not present

## 2020-05-04 DIAGNOSIS — M5412 Radiculopathy, cervical region: Secondary | ICD-10-CM | POA: Diagnosis not present

## 2020-05-04 DIAGNOSIS — M62830 Muscle spasm of back: Secondary | ICD-10-CM | POA: Diagnosis not present

## 2020-05-05 DIAGNOSIS — E538 Deficiency of other specified B group vitamins: Secondary | ICD-10-CM | POA: Diagnosis not present

## 2020-05-10 DIAGNOSIS — G8929 Other chronic pain: Secondary | ICD-10-CM | POA: Diagnosis not present

## 2020-05-10 DIAGNOSIS — M79674 Pain in right toe(s): Secondary | ICD-10-CM | POA: Diagnosis not present

## 2020-05-10 DIAGNOSIS — M79675 Pain in left toe(s): Secondary | ICD-10-CM | POA: Diagnosis not present

## 2020-05-10 DIAGNOSIS — F320 Major depressive disorder, single episode, mild: Secondary | ICD-10-CM | POA: Diagnosis not present

## 2020-05-10 DIAGNOSIS — M5010 Cervical disc disorder with radiculopathy, unspecified cervical region: Secondary | ICD-10-CM | POA: Diagnosis not present

## 2020-05-12 DIAGNOSIS — M19072 Primary osteoarthritis, left ankle and foot: Secondary | ICD-10-CM | POA: Diagnosis not present

## 2020-05-12 DIAGNOSIS — M65872 Other synovitis and tenosynovitis, left ankle and foot: Secondary | ICD-10-CM | POA: Diagnosis not present

## 2020-05-12 DIAGNOSIS — I10 Essential (primary) hypertension: Secondary | ICD-10-CM | POA: Diagnosis not present

## 2020-05-12 DIAGNOSIS — M79672 Pain in left foot: Secondary | ICD-10-CM | POA: Diagnosis not present

## 2020-05-12 DIAGNOSIS — M659 Synovitis and tenosynovitis, unspecified: Secondary | ICD-10-CM | POA: Diagnosis not present

## 2020-05-17 IMAGING — CT CT ABDOMEN AND PELVIS WITH CONTRAST
2 of 5 series · 16 of 46 positions shown, 18 images · IV contrast (APPLIED)
Comparison: 12/21/2015

CLINICAL DATA: "Rose colored" bleeding from rectum x today, LLQ
pain x 3 days

EXAM:
CT ABDOMEN AND PELVIS WITH CONTRAST
TECHNIQUE: Multidetector CT imaging of the abdomen and pelvis was performed
using the standard protocol following bolus administration of
intravenous contrast.
CONTRAST:  100mL OMNIPAQUE IOHEXOL 300 MG/ML  SOLN

[Series 2: routine abd/pel with · axial · 0.68mm/px · z∈[-489,-104]mm · 13 of 87 slices shown, 15 images]
[im 5/87  soft-tissue]
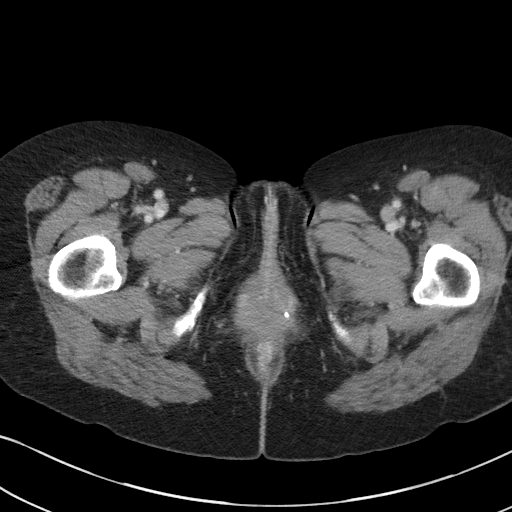
[im 5/87  bone]
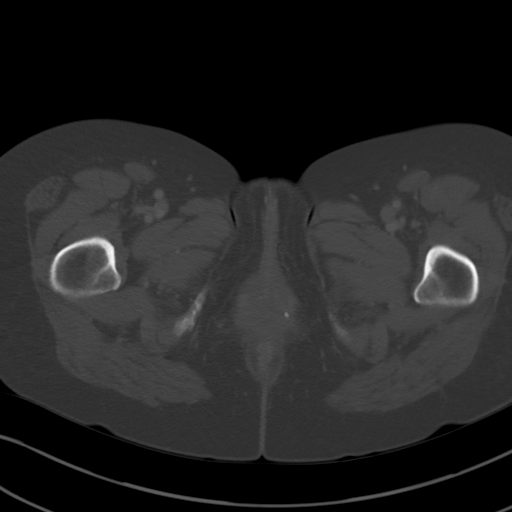
[im 14/87  soft-tissue]
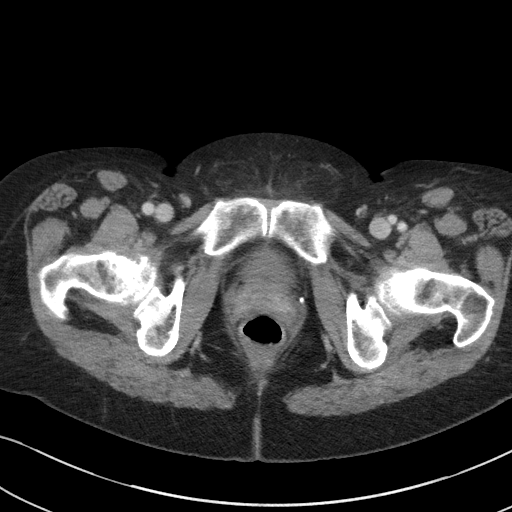
[im 19/87  soft-tissue]
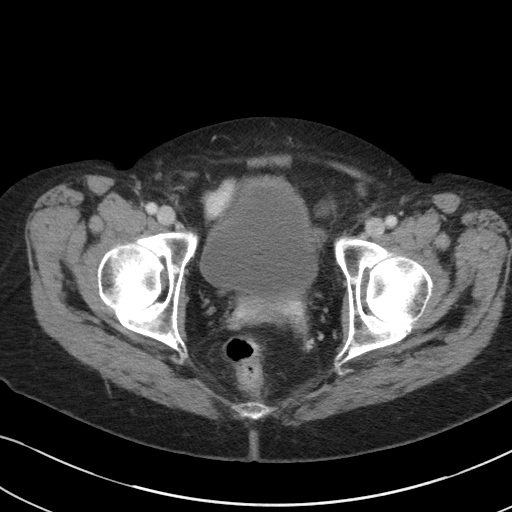
[im 23/87  soft-tissue]
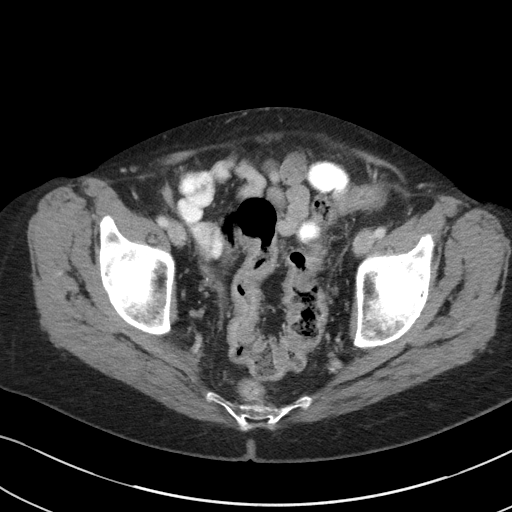
[im 32/87  soft-tissue]
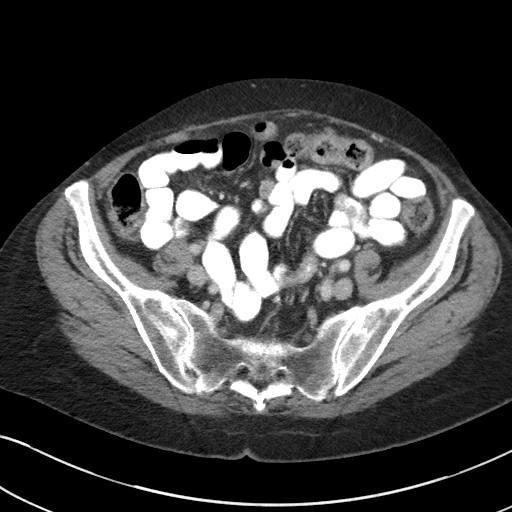
[im 37/87  soft-tissue]
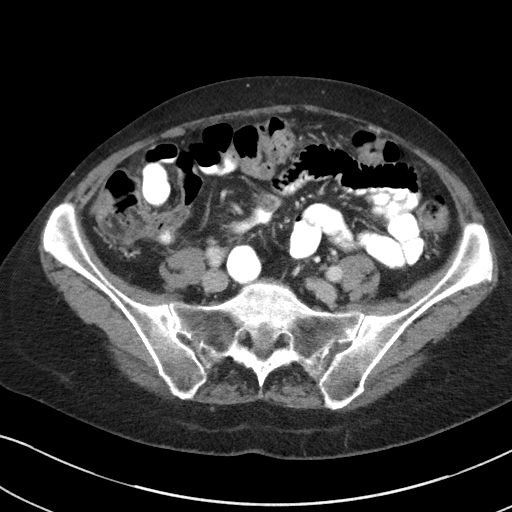
[im 46/87  soft-tissue]
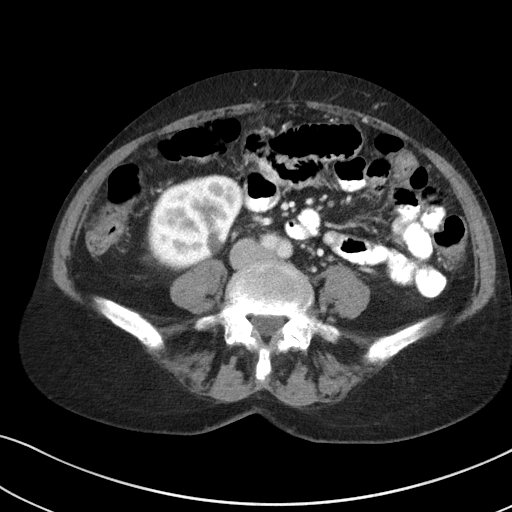
[im 50/87  soft-tissue]
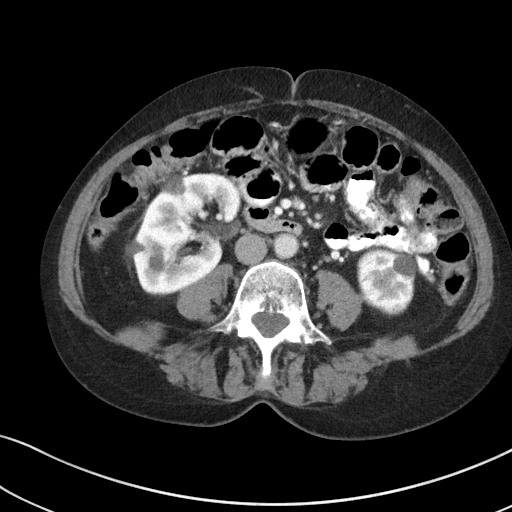
[im 55/87  soft-tissue]
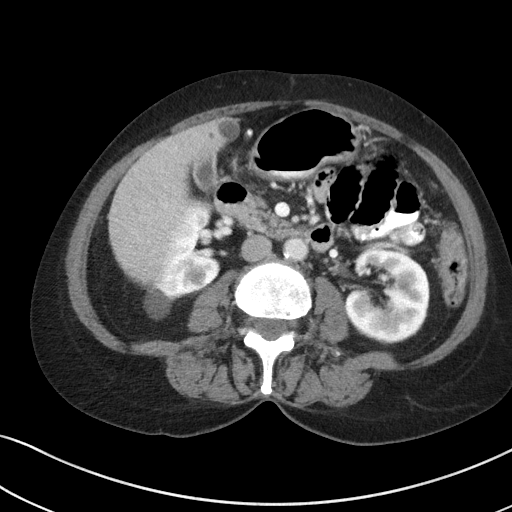
[im 55/87  bone]
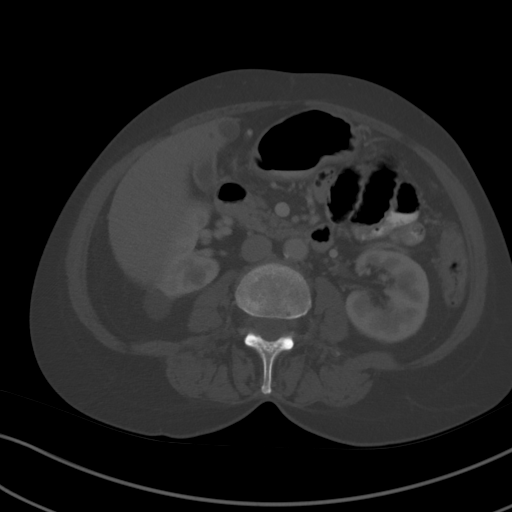
[im 64/87  soft-tissue]
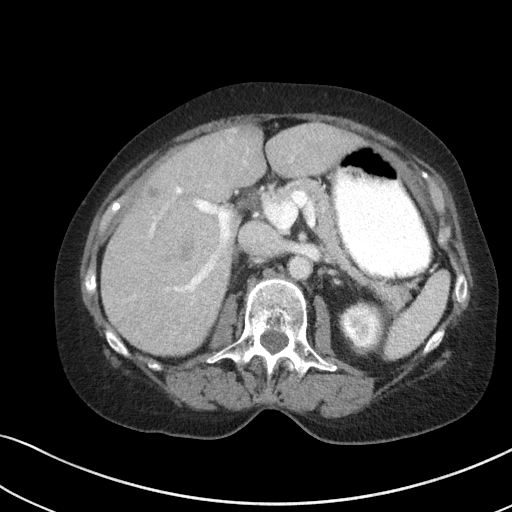
[im 68/87  soft-tissue]
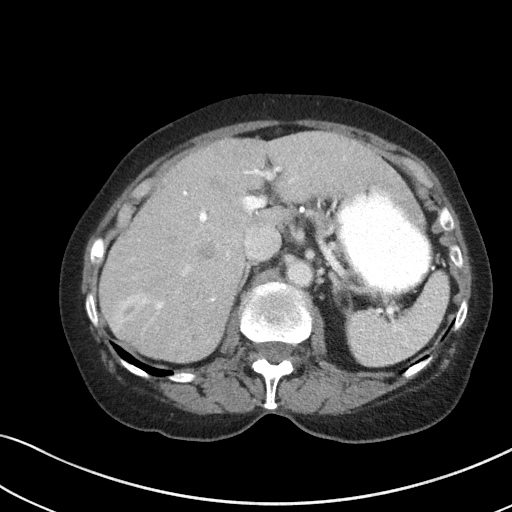
[im 73/87  soft-tissue]
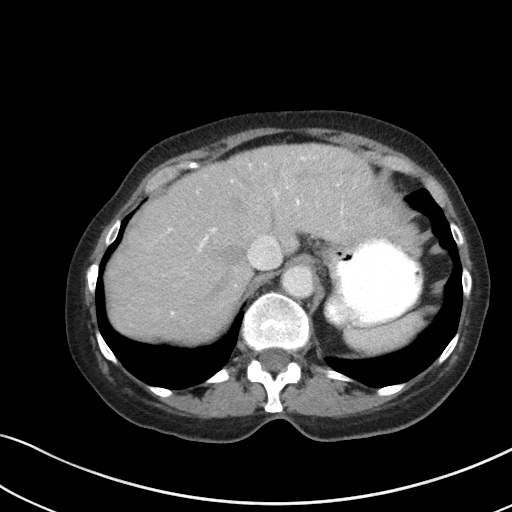
[im 82/87  soft-tissue]
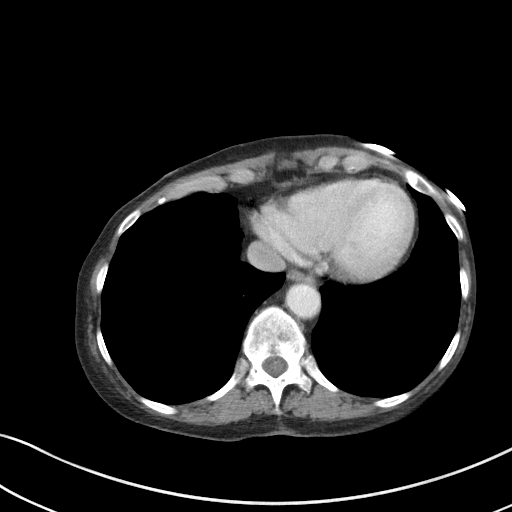

[Series 5: coronal st · coronal · 0.71mm/px · 3 of 91 slices shown]
[im 31/91  soft-tissue]
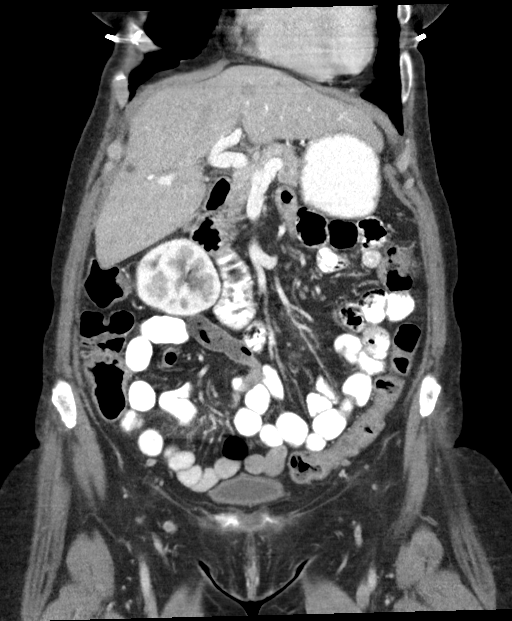
[im 41/91  soft-tissue]
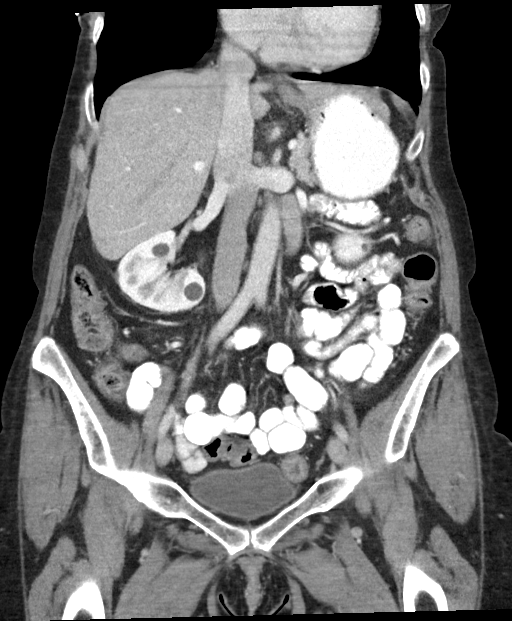
[im 51/91  soft-tissue]
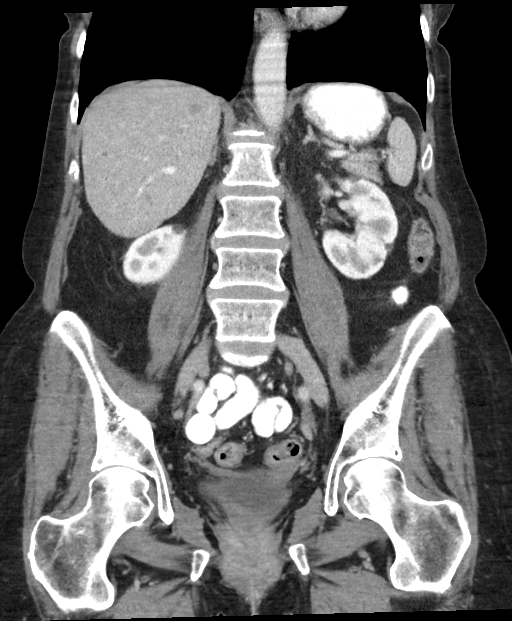

[16 of 46 positions shown; findings below may reference images not displayed]

FINDINGS: Lower chest: No acute abnormality.

Hepatobiliary: Enhancing 2.8 x 1.2 cm mass with central
hypoenhancement in the right hepatic lobe. Multiple hypodense, fluid
attenuating hepatic masses consistent with cysts with the largest
left hepatic mass measuring 1.7 cm. 12 mm right hepatic cyst. No
intrahepatic or extrahepatic biliary ductal dilatation. Normal
gallbladder.

Pancreas: Unremarkable. No pancreatic ductal dilatation or
surrounding inflammatory changes.

Spleen: Normal in size without focal abnormality.

Adrenals/Urinary Tract: Normal bilateral adrenal glands. Multiple
hypodense, fluid attenuating renal masses most consistent with cysts
bilaterally. No urolithiasis or obstructive uropathy. Normal
bladder.

Stomach/Bowel: Stomach is within normal limits. No evidence of bowel
wall thickening, distention, or inflammatory changes.

Vascular/Lymphatic: Normal caliber abdominal aorta. No
lymphadenopathy.

Reproductive: Status post hysterectomy. No adnexal masses.

Other: No abdominal wall hernia or abnormality. No abdominopelvic
ascites.

Musculoskeletal: No acute osseous abnormality. No aggressive osseous
lesion.
IMPRESSION: 1. No acute abdominal or pelvic pathology.
2. 2.8 x 1.2 cm enhancing right hepatic mass with central low
density. This is of indeterminate etiology and new compared with
12/21/2015. Recommend further characterization with a non emergent
MRI of the abdomen.

## 2020-06-01 DIAGNOSIS — M62830 Muscle spasm of back: Secondary | ICD-10-CM | POA: Diagnosis not present

## 2020-06-01 DIAGNOSIS — M5412 Radiculopathy, cervical region: Secondary | ICD-10-CM | POA: Diagnosis not present

## 2020-06-01 DIAGNOSIS — M9901 Segmental and somatic dysfunction of cervical region: Secondary | ICD-10-CM | POA: Diagnosis not present

## 2020-06-01 DIAGNOSIS — M9902 Segmental and somatic dysfunction of thoracic region: Secondary | ICD-10-CM | POA: Diagnosis not present

## 2020-06-06 DIAGNOSIS — E538 Deficiency of other specified B group vitamins: Secondary | ICD-10-CM | POA: Diagnosis not present

## 2020-06-09 DIAGNOSIS — M19041 Primary osteoarthritis, right hand: Secondary | ICD-10-CM | POA: Diagnosis not present

## 2020-06-09 DIAGNOSIS — M19042 Primary osteoarthritis, left hand: Secondary | ICD-10-CM | POA: Diagnosis not present

## 2020-06-09 DIAGNOSIS — R768 Other specified abnormal immunological findings in serum: Secondary | ICD-10-CM | POA: Diagnosis not present

## 2020-06-09 DIAGNOSIS — Z1159 Encounter for screening for other viral diseases: Secondary | ICD-10-CM | POA: Diagnosis not present

## 2020-06-13 DIAGNOSIS — M9902 Segmental and somatic dysfunction of thoracic region: Secondary | ICD-10-CM | POA: Diagnosis not present

## 2020-06-13 DIAGNOSIS — M9901 Segmental and somatic dysfunction of cervical region: Secondary | ICD-10-CM | POA: Diagnosis not present

## 2020-06-13 DIAGNOSIS — M62830 Muscle spasm of back: Secondary | ICD-10-CM | POA: Diagnosis not present

## 2020-06-13 DIAGNOSIS — M5412 Radiculopathy, cervical region: Secondary | ICD-10-CM | POA: Diagnosis not present

## 2020-07-07 DIAGNOSIS — E538 Deficiency of other specified B group vitamins: Secondary | ICD-10-CM | POA: Diagnosis not present

## 2020-07-11 DIAGNOSIS — M5412 Radiculopathy, cervical region: Secondary | ICD-10-CM | POA: Diagnosis not present

## 2020-07-11 DIAGNOSIS — M9902 Segmental and somatic dysfunction of thoracic region: Secondary | ICD-10-CM | POA: Diagnosis not present

## 2020-07-11 DIAGNOSIS — M62830 Muscle spasm of back: Secondary | ICD-10-CM | POA: Diagnosis not present

## 2020-07-11 DIAGNOSIS — M9901 Segmental and somatic dysfunction of cervical region: Secondary | ICD-10-CM | POA: Diagnosis not present

## 2020-08-03 DIAGNOSIS — M9902 Segmental and somatic dysfunction of thoracic region: Secondary | ICD-10-CM | POA: Diagnosis not present

## 2020-08-03 DIAGNOSIS — M62830 Muscle spasm of back: Secondary | ICD-10-CM | POA: Diagnosis not present

## 2020-08-03 DIAGNOSIS — M5412 Radiculopathy, cervical region: Secondary | ICD-10-CM | POA: Diagnosis not present

## 2020-08-03 DIAGNOSIS — M9901 Segmental and somatic dysfunction of cervical region: Secondary | ICD-10-CM | POA: Diagnosis not present

## 2020-08-08 DIAGNOSIS — E538 Deficiency of other specified B group vitamins: Secondary | ICD-10-CM | POA: Diagnosis not present

## 2020-08-09 DIAGNOSIS — M5412 Radiculopathy, cervical region: Secondary | ICD-10-CM | POA: Diagnosis not present

## 2020-08-09 DIAGNOSIS — M62830 Muscle spasm of back: Secondary | ICD-10-CM | POA: Diagnosis not present

## 2020-08-09 DIAGNOSIS — M9902 Segmental and somatic dysfunction of thoracic region: Secondary | ICD-10-CM | POA: Diagnosis not present

## 2020-08-09 DIAGNOSIS — M9901 Segmental and somatic dysfunction of cervical region: Secondary | ICD-10-CM | POA: Diagnosis not present

## 2020-08-31 DIAGNOSIS — M9901 Segmental and somatic dysfunction of cervical region: Secondary | ICD-10-CM | POA: Diagnosis not present

## 2020-08-31 DIAGNOSIS — M62830 Muscle spasm of back: Secondary | ICD-10-CM | POA: Diagnosis not present

## 2020-08-31 DIAGNOSIS — M5412 Radiculopathy, cervical region: Secondary | ICD-10-CM | POA: Diagnosis not present

## 2020-08-31 DIAGNOSIS — E538 Deficiency of other specified B group vitamins: Secondary | ICD-10-CM | POA: Diagnosis not present

## 2020-08-31 DIAGNOSIS — E782 Mixed hyperlipidemia: Secondary | ICD-10-CM | POA: Diagnosis not present

## 2020-08-31 DIAGNOSIS — M9902 Segmental and somatic dysfunction of thoracic region: Secondary | ICD-10-CM | POA: Diagnosis not present

## 2020-09-07 DIAGNOSIS — E782 Mixed hyperlipidemia: Secondary | ICD-10-CM | POA: Diagnosis not present

## 2020-09-07 DIAGNOSIS — Z23 Encounter for immunization: Secondary | ICD-10-CM | POA: Diagnosis not present

## 2020-09-07 DIAGNOSIS — Z0000 Encounter for general adult medical examination without abnormal findings: Secondary | ICD-10-CM | POA: Diagnosis not present

## 2020-09-07 DIAGNOSIS — E538 Deficiency of other specified B group vitamins: Secondary | ICD-10-CM | POA: Diagnosis not present

## 2020-09-07 DIAGNOSIS — F330 Major depressive disorder, recurrent, mild: Secondary | ICD-10-CM | POA: Diagnosis not present

## 2020-09-08 DIAGNOSIS — E538 Deficiency of other specified B group vitamins: Secondary | ICD-10-CM | POA: Diagnosis not present

## 2020-09-20 ENCOUNTER — Other Ambulatory Visit: Payer: Self-pay | Admitting: Internal Medicine

## 2020-09-20 DIAGNOSIS — Z1231 Encounter for screening mammogram for malignant neoplasm of breast: Secondary | ICD-10-CM

## 2020-09-21 DIAGNOSIS — M9902 Segmental and somatic dysfunction of thoracic region: Secondary | ICD-10-CM | POA: Diagnosis not present

## 2020-09-21 DIAGNOSIS — M9901 Segmental and somatic dysfunction of cervical region: Secondary | ICD-10-CM | POA: Diagnosis not present

## 2020-09-21 DIAGNOSIS — M5412 Radiculopathy, cervical region: Secondary | ICD-10-CM | POA: Diagnosis not present

## 2020-09-21 DIAGNOSIS — M62830 Muscle spasm of back: Secondary | ICD-10-CM | POA: Diagnosis not present

## 2020-10-05 DIAGNOSIS — M9902 Segmental and somatic dysfunction of thoracic region: Secondary | ICD-10-CM | POA: Diagnosis not present

## 2020-10-05 DIAGNOSIS — M9901 Segmental and somatic dysfunction of cervical region: Secondary | ICD-10-CM | POA: Diagnosis not present

## 2020-10-05 DIAGNOSIS — M5412 Radiculopathy, cervical region: Secondary | ICD-10-CM | POA: Diagnosis not present

## 2020-10-05 DIAGNOSIS — M62830 Muscle spasm of back: Secondary | ICD-10-CM | POA: Diagnosis not present

## 2020-10-10 ENCOUNTER — Other Ambulatory Visit: Payer: Self-pay

## 2020-10-10 ENCOUNTER — Ambulatory Visit
Admission: RE | Admit: 2020-10-10 | Discharge: 2020-10-10 | Disposition: A | Discharge status: Home / Self Care | Payer: PPO | Source: Ambulatory Visit | Attending: Internal Medicine | Admitting: Internal Medicine

## 2020-10-10 DIAGNOSIS — Z1231 Encounter for screening mammogram for malignant neoplasm of breast: Secondary | ICD-10-CM | POA: Diagnosis not present

## 2020-10-11 DIAGNOSIS — L299 Pruritus, unspecified: Secondary | ICD-10-CM | POA: Diagnosis not present

## 2020-10-11 DIAGNOSIS — E538 Deficiency of other specified B group vitamins: Secondary | ICD-10-CM | POA: Diagnosis not present

## 2020-10-19 DIAGNOSIS — M9901 Segmental and somatic dysfunction of cervical region: Secondary | ICD-10-CM | POA: Diagnosis not present

## 2020-10-19 DIAGNOSIS — M62830 Muscle spasm of back: Secondary | ICD-10-CM | POA: Diagnosis not present

## 2020-10-19 DIAGNOSIS — M9902 Segmental and somatic dysfunction of thoracic region: Secondary | ICD-10-CM | POA: Diagnosis not present

## 2020-10-19 DIAGNOSIS — M5412 Radiculopathy, cervical region: Secondary | ICD-10-CM | POA: Diagnosis not present

## 2020-11-02 DIAGNOSIS — M62830 Muscle spasm of back: Secondary | ICD-10-CM | POA: Diagnosis not present

## 2020-11-02 DIAGNOSIS — M9901 Segmental and somatic dysfunction of cervical region: Secondary | ICD-10-CM | POA: Diagnosis not present

## 2020-11-02 DIAGNOSIS — M9902 Segmental and somatic dysfunction of thoracic region: Secondary | ICD-10-CM | POA: Diagnosis not present

## 2020-11-02 DIAGNOSIS — M5412 Radiculopathy, cervical region: Secondary | ICD-10-CM | POA: Diagnosis not present

## 2020-11-09 DIAGNOSIS — H26493 Other secondary cataract, bilateral: Secondary | ICD-10-CM | POA: Diagnosis not present

## 2020-11-11 DIAGNOSIS — E538 Deficiency of other specified B group vitamins: Secondary | ICD-10-CM | POA: Diagnosis not present

## 2020-11-16 DIAGNOSIS — M9901 Segmental and somatic dysfunction of cervical region: Secondary | ICD-10-CM | POA: Diagnosis not present

## 2020-11-16 DIAGNOSIS — M5412 Radiculopathy, cervical region: Secondary | ICD-10-CM | POA: Diagnosis not present

## 2020-11-16 DIAGNOSIS — M9902 Segmental and somatic dysfunction of thoracic region: Secondary | ICD-10-CM | POA: Diagnosis not present

## 2020-11-16 DIAGNOSIS — M62830 Muscle spasm of back: Secondary | ICD-10-CM | POA: Diagnosis not present

## 2020-11-30 DIAGNOSIS — M9901 Segmental and somatic dysfunction of cervical region: Secondary | ICD-10-CM | POA: Diagnosis not present

## 2020-11-30 DIAGNOSIS — M5412 Radiculopathy, cervical region: Secondary | ICD-10-CM | POA: Diagnosis not present

## 2020-11-30 DIAGNOSIS — M9902 Segmental and somatic dysfunction of thoracic region: Secondary | ICD-10-CM | POA: Diagnosis not present

## 2020-11-30 DIAGNOSIS — M62830 Muscle spasm of back: Secondary | ICD-10-CM | POA: Diagnosis not present

## 2020-12-12 DIAGNOSIS — E538 Deficiency of other specified B group vitamins: Secondary | ICD-10-CM | POA: Diagnosis not present

## 2020-12-13 DIAGNOSIS — M9901 Segmental and somatic dysfunction of cervical region: Secondary | ICD-10-CM | POA: Diagnosis not present

## 2020-12-13 DIAGNOSIS — M62830 Muscle spasm of back: Secondary | ICD-10-CM | POA: Diagnosis not present

## 2020-12-13 DIAGNOSIS — M9902 Segmental and somatic dysfunction of thoracic region: Secondary | ICD-10-CM | POA: Diagnosis not present

## 2020-12-13 DIAGNOSIS — M5412 Radiculopathy, cervical region: Secondary | ICD-10-CM | POA: Diagnosis not present

## 2020-12-28 DIAGNOSIS — M9901 Segmental and somatic dysfunction of cervical region: Secondary | ICD-10-CM | POA: Diagnosis not present

## 2020-12-28 DIAGNOSIS — M62830 Muscle spasm of back: Secondary | ICD-10-CM | POA: Diagnosis not present

## 2020-12-28 DIAGNOSIS — M9902 Segmental and somatic dysfunction of thoracic region: Secondary | ICD-10-CM | POA: Diagnosis not present

## 2020-12-28 DIAGNOSIS — M5412 Radiculopathy, cervical region: Secondary | ICD-10-CM | POA: Diagnosis not present

## 2021-01-13 DIAGNOSIS — E538 Deficiency of other specified B group vitamins: Secondary | ICD-10-CM | POA: Diagnosis not present

## 2021-01-17 DIAGNOSIS — M62830 Muscle spasm of back: Secondary | ICD-10-CM | POA: Diagnosis not present

## 2021-01-17 DIAGNOSIS — M9901 Segmental and somatic dysfunction of cervical region: Secondary | ICD-10-CM | POA: Diagnosis not present

## 2021-01-17 DIAGNOSIS — M5412 Radiculopathy, cervical region: Secondary | ICD-10-CM | POA: Diagnosis not present

## 2021-01-17 DIAGNOSIS — M9902 Segmental and somatic dysfunction of thoracic region: Secondary | ICD-10-CM | POA: Diagnosis not present

## 2021-02-01 DIAGNOSIS — D485 Neoplasm of uncertain behavior of skin: Secondary | ICD-10-CM | POA: Diagnosis not present

## 2021-02-01 DIAGNOSIS — D2261 Melanocytic nevi of right upper limb, including shoulder: Secondary | ICD-10-CM | POA: Diagnosis not present

## 2021-02-01 DIAGNOSIS — L308 Other specified dermatitis: Secondary | ICD-10-CM | POA: Diagnosis not present

## 2021-02-01 DIAGNOSIS — D2271 Melanocytic nevi of right lower limb, including hip: Secondary | ICD-10-CM | POA: Diagnosis not present

## 2021-02-01 DIAGNOSIS — D2272 Melanocytic nevi of left lower limb, including hip: Secondary | ICD-10-CM | POA: Diagnosis not present

## 2021-02-01 DIAGNOSIS — D225 Melanocytic nevi of trunk: Secondary | ICD-10-CM | POA: Diagnosis not present

## 2021-02-01 DIAGNOSIS — D2262 Melanocytic nevi of left upper limb, including shoulder: Secondary | ICD-10-CM | POA: Diagnosis not present

## 2021-02-01 DIAGNOSIS — L570 Actinic keratosis: Secondary | ICD-10-CM | POA: Diagnosis not present

## 2021-02-08 DIAGNOSIS — M5412 Radiculopathy, cervical region: Secondary | ICD-10-CM | POA: Diagnosis not present

## 2021-02-08 DIAGNOSIS — M9902 Segmental and somatic dysfunction of thoracic region: Secondary | ICD-10-CM | POA: Diagnosis not present

## 2021-02-08 DIAGNOSIS — M9901 Segmental and somatic dysfunction of cervical region: Secondary | ICD-10-CM | POA: Diagnosis not present

## 2021-02-08 DIAGNOSIS — M62830 Muscle spasm of back: Secondary | ICD-10-CM | POA: Diagnosis not present

## 2021-02-14 DIAGNOSIS — E538 Deficiency of other specified B group vitamins: Secondary | ICD-10-CM | POA: Diagnosis not present

## 2021-02-20 DIAGNOSIS — H35033 Hypertensive retinopathy, bilateral: Secondary | ICD-10-CM | POA: Diagnosis not present

## 2021-02-20 DIAGNOSIS — H26491 Other secondary cataract, right eye: Secondary | ICD-10-CM | POA: Diagnosis not present

## 2021-02-20 DIAGNOSIS — H02881 Meibomian gland dysfunction right upper eyelid: Secondary | ICD-10-CM | POA: Diagnosis not present

## 2021-02-20 DIAGNOSIS — H26493 Other secondary cataract, bilateral: Secondary | ICD-10-CM | POA: Diagnosis not present

## 2021-02-20 DIAGNOSIS — H35362 Drusen (degenerative) of macula, left eye: Secondary | ICD-10-CM | POA: Diagnosis not present

## 2021-03-01 DIAGNOSIS — M5412 Radiculopathy, cervical region: Secondary | ICD-10-CM | POA: Diagnosis not present

## 2021-03-01 DIAGNOSIS — M9901 Segmental and somatic dysfunction of cervical region: Secondary | ICD-10-CM | POA: Diagnosis not present

## 2021-03-01 DIAGNOSIS — M62830 Muscle spasm of back: Secondary | ICD-10-CM | POA: Diagnosis not present

## 2021-03-01 DIAGNOSIS — M9902 Segmental and somatic dysfunction of thoracic region: Secondary | ICD-10-CM | POA: Diagnosis not present

## 2021-03-06 DIAGNOSIS — H26492 Other secondary cataract, left eye: Secondary | ICD-10-CM | POA: Diagnosis not present

## 2021-03-17 DIAGNOSIS — E538 Deficiency of other specified B group vitamins: Secondary | ICD-10-CM | POA: Diagnosis not present

## 2021-04-05 DIAGNOSIS — M9901 Segmental and somatic dysfunction of cervical region: Secondary | ICD-10-CM | POA: Diagnosis not present

## 2021-04-05 DIAGNOSIS — M9902 Segmental and somatic dysfunction of thoracic region: Secondary | ICD-10-CM | POA: Diagnosis not present

## 2021-04-05 DIAGNOSIS — M9903 Segmental and somatic dysfunction of lumbar region: Secondary | ICD-10-CM | POA: Diagnosis not present

## 2021-04-05 DIAGNOSIS — M62830 Muscle spasm of back: Secondary | ICD-10-CM | POA: Diagnosis not present

## 2021-04-17 DIAGNOSIS — M9903 Segmental and somatic dysfunction of lumbar region: Secondary | ICD-10-CM | POA: Diagnosis not present

## 2021-04-17 DIAGNOSIS — M9902 Segmental and somatic dysfunction of thoracic region: Secondary | ICD-10-CM | POA: Diagnosis not present

## 2021-04-17 DIAGNOSIS — E538 Deficiency of other specified B group vitamins: Secondary | ICD-10-CM | POA: Diagnosis not present

## 2021-04-17 DIAGNOSIS — M62830 Muscle spasm of back: Secondary | ICD-10-CM | POA: Diagnosis not present

## 2021-04-17 DIAGNOSIS — M9901 Segmental and somatic dysfunction of cervical region: Secondary | ICD-10-CM | POA: Diagnosis not present

## 2021-05-03 DIAGNOSIS — M9902 Segmental and somatic dysfunction of thoracic region: Secondary | ICD-10-CM | POA: Diagnosis not present

## 2021-05-03 DIAGNOSIS — M62830 Muscle spasm of back: Secondary | ICD-10-CM | POA: Diagnosis not present

## 2021-05-03 DIAGNOSIS — M9903 Segmental and somatic dysfunction of lumbar region: Secondary | ICD-10-CM | POA: Diagnosis not present

## 2021-05-03 DIAGNOSIS — M9901 Segmental and somatic dysfunction of cervical region: Secondary | ICD-10-CM | POA: Diagnosis not present

## 2021-05-18 DIAGNOSIS — E538 Deficiency of other specified B group vitamins: Secondary | ICD-10-CM | POA: Diagnosis not present

## 2021-05-31 DIAGNOSIS — M9903 Segmental and somatic dysfunction of lumbar region: Secondary | ICD-10-CM | POA: Diagnosis not present

## 2021-05-31 DIAGNOSIS — M9902 Segmental and somatic dysfunction of thoracic region: Secondary | ICD-10-CM | POA: Diagnosis not present

## 2021-05-31 DIAGNOSIS — M62830 Muscle spasm of back: Secondary | ICD-10-CM | POA: Diagnosis not present

## 2021-05-31 DIAGNOSIS — M9901 Segmental and somatic dysfunction of cervical region: Secondary | ICD-10-CM | POA: Diagnosis not present

## 2021-06-14 DIAGNOSIS — K5909 Other constipation: Secondary | ICD-10-CM | POA: Diagnosis not present

## 2021-06-14 DIAGNOSIS — Z86010 Personal history of colonic polyps: Secondary | ICD-10-CM | POA: Diagnosis not present

## 2021-06-16 DIAGNOSIS — L237 Allergic contact dermatitis due to plants, except food: Secondary | ICD-10-CM | POA: Diagnosis not present

## 2021-06-19 DIAGNOSIS — E538 Deficiency of other specified B group vitamins: Secondary | ICD-10-CM | POA: Diagnosis not present

## 2021-07-12 DIAGNOSIS — M9902 Segmental and somatic dysfunction of thoracic region: Secondary | ICD-10-CM | POA: Diagnosis not present

## 2021-07-12 DIAGNOSIS — M9901 Segmental and somatic dysfunction of cervical region: Secondary | ICD-10-CM | POA: Diagnosis not present

## 2021-07-12 DIAGNOSIS — M62830 Muscle spasm of back: Secondary | ICD-10-CM | POA: Diagnosis not present

## 2021-07-12 DIAGNOSIS — M9903 Segmental and somatic dysfunction of lumbar region: Secondary | ICD-10-CM | POA: Diagnosis not present

## 2021-07-21 DIAGNOSIS — E538 Deficiency of other specified B group vitamins: Secondary | ICD-10-CM | POA: Diagnosis not present

## 2021-08-02 DIAGNOSIS — M9902 Segmental and somatic dysfunction of thoracic region: Secondary | ICD-10-CM | POA: Diagnosis not present

## 2021-08-02 DIAGNOSIS — M9903 Segmental and somatic dysfunction of lumbar region: Secondary | ICD-10-CM | POA: Diagnosis not present

## 2021-08-02 DIAGNOSIS — M62830 Muscle spasm of back: Secondary | ICD-10-CM | POA: Diagnosis not present

## 2021-08-02 DIAGNOSIS — M9901 Segmental and somatic dysfunction of cervical region: Secondary | ICD-10-CM | POA: Diagnosis not present

## 2021-08-21 DIAGNOSIS — E538 Deficiency of other specified B group vitamins: Secondary | ICD-10-CM | POA: Diagnosis not present

## 2021-09-01 DIAGNOSIS — E538 Deficiency of other specified B group vitamins: Secondary | ICD-10-CM | POA: Diagnosis not present

## 2021-09-01 DIAGNOSIS — E782 Mixed hyperlipidemia: Secondary | ICD-10-CM | POA: Diagnosis not present

## 2021-09-06 DIAGNOSIS — M62830 Muscle spasm of back: Secondary | ICD-10-CM | POA: Diagnosis not present

## 2021-09-06 DIAGNOSIS — M9903 Segmental and somatic dysfunction of lumbar region: Secondary | ICD-10-CM | POA: Diagnosis not present

## 2021-09-06 DIAGNOSIS — M9902 Segmental and somatic dysfunction of thoracic region: Secondary | ICD-10-CM | POA: Diagnosis not present

## 2021-09-06 DIAGNOSIS — M9901 Segmental and somatic dysfunction of cervical region: Secondary | ICD-10-CM | POA: Diagnosis not present

## 2021-09-07 DIAGNOSIS — H26493 Other secondary cataract, bilateral: Secondary | ICD-10-CM | POA: Diagnosis not present

## 2021-09-14 ENCOUNTER — Other Ambulatory Visit: Payer: Self-pay | Admitting: Internal Medicine

## 2021-09-14 DIAGNOSIS — Z780 Asymptomatic menopausal state: Secondary | ICD-10-CM | POA: Diagnosis not present

## 2021-09-14 DIAGNOSIS — E782 Mixed hyperlipidemia: Secondary | ICD-10-CM | POA: Diagnosis not present

## 2021-09-14 DIAGNOSIS — Z1231 Encounter for screening mammogram for malignant neoplasm of breast: Secondary | ICD-10-CM

## 2021-09-14 DIAGNOSIS — Z0000 Encounter for general adult medical examination without abnormal findings: Secondary | ICD-10-CM | POA: Diagnosis not present

## 2021-09-14 DIAGNOSIS — E559 Vitamin D deficiency, unspecified: Secondary | ICD-10-CM | POA: Diagnosis not present

## 2021-09-14 DIAGNOSIS — E538 Deficiency of other specified B group vitamins: Secondary | ICD-10-CM | POA: Diagnosis not present

## 2021-09-14 DIAGNOSIS — F330 Major depressive disorder, recurrent, mild: Secondary | ICD-10-CM | POA: Diagnosis not present

## 2021-09-18 ENCOUNTER — Encounter: Payer: Self-pay | Admitting: *Deleted

## 2021-09-19 ENCOUNTER — Ambulatory Visit
Admission: RE | Admit: 2021-09-19 | Discharge: 2021-09-19 | Disposition: A | Discharge status: Home / Self Care | Payer: PPO | Attending: Gastroenterology | Admitting: Gastroenterology

## 2021-09-19 ENCOUNTER — Ambulatory Visit: Payer: PPO | Admitting: Registered Nurse

## 2021-09-19 ENCOUNTER — Encounter
Admission: RE | Disposition: A | Discharge status: Home / Self Care | Payer: Self-pay | Source: Home / Self Care | Attending: Gastroenterology

## 2021-09-19 ENCOUNTER — Encounter: Payer: Self-pay | Admitting: *Deleted

## 2021-09-19 DIAGNOSIS — Z79899 Other long term (current) drug therapy: Secondary | ICD-10-CM | POA: Insufficient documentation

## 2021-09-19 DIAGNOSIS — K649 Unspecified hemorrhoids: Secondary | ICD-10-CM | POA: Diagnosis not present

## 2021-09-19 DIAGNOSIS — Z1211 Encounter for screening for malignant neoplasm of colon: Secondary | ICD-10-CM | POA: Diagnosis not present

## 2021-09-19 DIAGNOSIS — D120 Benign neoplasm of cecum: Secondary | ICD-10-CM | POA: Diagnosis not present

## 2021-09-19 DIAGNOSIS — D122 Benign neoplasm of ascending colon: Secondary | ICD-10-CM | POA: Insufficient documentation

## 2021-09-19 DIAGNOSIS — K635 Polyp of colon: Secondary | ICD-10-CM | POA: Diagnosis not present

## 2021-09-19 DIAGNOSIS — Z888 Allergy status to other drugs, medicaments and biological substances status: Secondary | ICD-10-CM | POA: Insufficient documentation

## 2021-09-19 DIAGNOSIS — Z885 Allergy status to narcotic agent status: Secondary | ICD-10-CM | POA: Insufficient documentation

## 2021-09-19 DIAGNOSIS — I10 Essential (primary) hypertension: Secondary | ICD-10-CM | POA: Diagnosis not present

## 2021-09-19 DIAGNOSIS — K640 First degree hemorrhoids: Secondary | ICD-10-CM | POA: Diagnosis not present

## 2021-09-19 DIAGNOSIS — Z86010 Personal history of colonic polyps: Secondary | ICD-10-CM | POA: Diagnosis not present

## 2021-09-19 DIAGNOSIS — D126 Benign neoplasm of colon, unspecified: Secondary | ICD-10-CM | POA: Diagnosis not present

## 2021-09-19 DIAGNOSIS — Z87891 Personal history of nicotine dependence: Secondary | ICD-10-CM | POA: Diagnosis not present

## 2021-09-19 HISTORY — DX: Deficiency of other specified B group vitamins: E53.8

## 2021-09-19 HISTORY — DX: Cervical disc disorder, unspecified, unspecified cervical region: M50.90

## 2021-09-19 HISTORY — DX: Unspecified osteoarthritis, unspecified site: M19.90

## 2021-09-19 HISTORY — PX: COLONOSCOPY WITH PROPOFOL: SHX5780

## 2021-09-19 HISTORY — DX: Depression, unspecified: F32.A

## 2021-09-19 HISTORY — DX: Arteriovenous malformation of digestive system vessel: Q27.33

## 2021-09-19 SURGERY — COLONOSCOPY WITH PROPOFOL
Anesthesia: General

## 2021-09-19 MED ORDER — PROPOFOL 10 MG/ML IV BOLUS
INTRAVENOUS | Status: DC | PRN
  Administered 2021-09-19: 10 mg via INTRAVENOUS
  Administered 2021-09-19: 100 mg via INTRAVENOUS

## 2021-09-19 MED ORDER — PROPOFOL 500 MG/50ML IV EMUL
INTRAVENOUS | Status: DC | PRN
  Administered 2021-09-19: 120 ug/kg/min via INTRAVENOUS

## 2021-09-19 MED ORDER — SODIUM CHLORIDE 0.9 % IV SOLN: INTRAVENOUS | Status: DC

## 2021-09-19 NOTE — Interval H&P Note (Signed)
History and Physical Interval Note:  09/19/2021 12:28 PM  Loretta Ramsey  has presented today for surgery, with the diagnosis of HX OF POLYPS.  The various methods of treatment have been discussed with the patient and family. After consideration of risks, benefits and other options for treatment, the patient has consented to  Procedure(s): COLONOSCOPY WITH PROPOFOL (N/A) as a surgical intervention.  The patient's history has been reviewed, patient examined, no change in status, stable for surgery.  I have reviewed the patient's chart and labs.  Questions were answered to the patient's satisfaction.     Lesly Rubenstein  Ok to proceed with colonoscopy

## 2021-09-19 NOTE — Anesthesia Postprocedure Evaluation (Signed)
Anesthesia Post Note  Patient: JIMI GIZA  Procedure(s) Performed: COLONOSCOPY WITH PROPOFOL  Patient location during evaluation: Endoscopy Anesthesia Type: General Level of consciousness: awake and alert Pain management: pain level controlled Vital Signs Assessment: post-procedure vital signs reviewed and stable Respiratory status: spontaneous breathing, nonlabored ventilation, respiratory function stable and patient connected to nasal cannula oxygen Cardiovascular status: blood pressure returned to baseline and stable Postop Assessment: no apparent nausea or vomiting Anesthetic complications: no   No notable events documented.   Last Vitals:  Vitals:   09/19/21 1317 09/19/21 1327  BP: 103/82 134/72  Pulse: 77 73  Resp: (!) 22 16  Temp:    SpO2: 100% 100%    Last Pain:  Vitals:   09/19/21 1327  TempSrc:   PainSc: 0-No pain                 Martha Clan

## 2021-09-19 NOTE — Anesthesia Preprocedure Evaluation (Signed)
Anesthesia Evaluation  Patient identified by MRN, date of birth, ID band Patient awake    Reviewed: Allergy & Precautions, H&P , NPO status , Patient's Chart, lab work & pertinent test results, reviewed documented beta blocker date and time   History of Anesthesia Complications Negative for: history of anesthetic complications  Airway Mallampati: I   Neck ROM: full    Dental  (+) Poor Dentition, Dental Advidsory Given, Missing   Pulmonary neg pulmonary ROS, former smoker,    Pulmonary exam normal        Cardiovascular hypertension, (-) angina(-) Past MI and (-) Cardiac Stents Normal cardiovascular exam(-) dysrhythmias (-) Valvular Problems/Murmurs     Neuro/Psych PSYCHIATRIC DISORDERS Depression negative neurological ROS     GI/Hepatic Neg liver ROS, GERD  ,  Endo/Other  negative endocrine ROS  Renal/GU negative Renal ROS  negative genitourinary   Musculoskeletal   Abdominal   Peds  Hematology negative hematology ROS (+)   Anesthesia Other Findings Past Medical History:   Hypertension                                               Past Surgical History:   BREAST CYST ASPIRATION                          Left 2015       BMI    Body Mass Index   23.41 kg/m 2     Reproductive/Obstetrics                             Anesthesia Physical  Anesthesia Plan  ASA: 2  Anesthesia Plan: General   Post-op Pain Management:    Induction: Intravenous  PONV Risk Score and Plan: 3 and TIVA and Propofol infusion  Airway Management Planned: Natural Airway and Nasal Cannula  Additional Equipment:   Intra-op Plan:   Post-operative Plan:   Informed Consent: I have reviewed the patients History and Physical, chart, labs and discussed the procedure including the risks, benefits and alternatives for the proposed anesthesia with the patient or authorized representative who has indicated his/her  understanding and acceptance.     Dental Advisory Given  Plan Discussed with: CRNA  Anesthesia Plan Comments:         Anesthesia Quick Evaluation

## 2021-09-19 NOTE — Op Note (Signed)
[facility] Gastroenterology Patient Name: Loretta Ramsey Procedure Date: 09/19/2021 12:29 PM MRN: [id number] Account #: [id number] Date of Birth: 1946/06/04 Admit Type: Outpatient Age: 75 Room: [facility] ENDO ROOM 1 Gender: Female Note Status: Finalized Instrument Name: Jasper Riling [id number] Procedure:             Colonoscopy Indications:           Surveillance: Personal history of adenomatous polyps                         on last colonoscopy 5 years ago Providers:             Andrey Farmer MD, MD Referring MD:          Rusty Aus, MD (Referring MD) Medicines:             Monitored Anesthesia Care Complications:         No immediate complications. Estimated blood loss:                         Minimal. Procedure:             Pre-Anesthesia Assessment:                        - Prior to the procedure, a History and Physical was                         performed, and patient medications and allergies were                         reviewed. The patient is competent. The risks and                         benefits of the procedure and the sedation options and                         risks were discussed with the patient. All questions                         were answered and informed consent was obtained.                         Patient identification and proposed procedure were                         verified by the physician, the nurse, the anesthetist                         and the technician in the endoscopy suite. Mental                         Status Examination: alert and oriented. Airway                         Examination: normal oropharyngeal airway and neck                         mobility. Respiratory Examination: clear to  auscultation. CV Examination: normal. Prophylactic                         Antibiotics: The patient does not require prophylactic                         antibiotics. Prior Anticoagulants: The patient has                          taken no previous anticoagulant or antiplatelet                         agents. ASA Grade Assessment: II - A patient with mild                         systemic disease. After reviewing the risks and                         benefits, the patient was deemed in satisfactory                         condition to undergo the procedure. The anesthesia                         plan was to use monitored anesthesia care (MAC).                         Immediately prior to administration of medications,                         the patient was re-assessed for adequacy to receive                         sedatives. The heart rate, respiratory rate, oxygen                         saturations, blood pressure, adequacy of pulmonary                         ventilation, and response to care were monitored                         throughout the procedure. The physical status of the                         patient was re-assessed after the procedure.                        After obtaining informed consent, the colonoscope was                         passed under direct vision. Throughout the procedure,                         the patient's blood pressure, pulse, and oxygen                         saturations were monitored continuously. The  Colonoscope was introduced through the anus and                         advanced to the the cecum, identified by appendiceal                         orifice and ileocecal valve. The colonoscopy was                         performed without difficulty. The patient tolerated                         the procedure well. The quality of the bowel                         preparation was good. Findings:      The perianal and digital rectal examinations were normal.      A 2 mm polyp was found in the cecum. The polyp was sessile. The polyp       was removed with a jumbo cold forceps. Resection and retrieval were       complete. Estimated blood  loss was minimal.      A 2 to 3 mm polyp was found in the ascending colon. The polyp was       sessile. The polyp was removed with a cold snare. Resection and       retrieval were complete. Estimated blood loss was minimal.      A 1 mm polyp was found in the ascending colon. The polyp was sessile.       The polyp was removed with a jumbo cold forceps. Resection and retrieval       were complete. Estimated blood loss was minimal.      Internal hemorrhoids were found during retroflexion. The hemorrhoids       were Grade I (internal hemorrhoids that do not prolapse).      The exam was otherwise without abnormality on direct and retroflexion       views. Impression:            - One 2 mm polyp in the cecum, removed with a jumbo                         cold forceps. Resected and retrieved.                        - One 2 to 3 mm polyp in the ascending colon, removed                         with a cold snare. Resected and retrieved.                        - One 1 mm polyp in the ascending colon, removed with                         a jumbo cold forceps. Resected and retrieved.                        - Internal hemorrhoids.                        -  The examination was otherwise normal on direct and                         retroflexion views. Recommendation:        - Discharge patient to home.                        - Resume previous diet.                        - Continue present medications.                        - Await pathology results.                        - Repeat colonoscopy for surveillance based on                         pathology results.                        - Return to referring physician as previously                         scheduled. Procedure Code(s):     --- Professional ---                        [phone], Colonoscopy, flexible; with removal of                         tumor(s), polyp(s), or other lesion(s) by snare                         technique                         [id number], 92, Colonoscopy, flexible; with biopsy, single                         or multiple Diagnosis Code(s):     --- Professional ---                        Z86.010, Personal history of colonic polyps                        K63.5, Polyp of colon                        K64.0, First degree hemorrhoids CPT copyright 2019 [employer]. All rights reserved. The codes documented in this report are preliminary and upon coder review may  be revised to meet current compliance requirements. Andrey Farmer MD, MD 09/19/2021 12:58:17 PM Number of Addenda: 0 Note Initiated On: 09/19/2021 12:29 PM Scope Withdrawal Time: 0 hours 10 minutes 53 seconds  Total Procedure Duration: 0 hours 17 minutes 58 seconds  Estimated Blood Loss:  Estimated blood loss was minimal.      [facility]

## 2021-09-19 NOTE — H&P (Signed)
Outpatient short stay form Pre-procedure 09/19/2021  Loretta Rubenstein, MD  Primary Physician: Rusty Aus, MD  Reason for visit:  Surveillance  History of present illness:   75 y/o lady with history of constipation here for surveillance colonscopy. Her last colonoscopy in 2017 was normal. No blood thinners. History of hysterectomy. No family history of GI malignancies.    Current Facility-Administered Medications:    0.9 %  sodium chloride infusion, , Intravenous, Continuous, Takara Sermons, Hilton Cork, MD, Last Rate: 20 mL/hr at 09/19/21 1218, New Bag at 09/19/21 1218  Medications Prior to Admission  Medication Sig Dispense Refill Last Dose   buPROPion (WELLBUTRIN XL) 300 MG 24 hr tablet Take 300 mg by mouth daily.   09/19/2021 at 0700   lisinopril-hydrochlorothiazide (PRINZIDE,ZESTORETIC) 10-12.5 MG tablet Take 1 tablet by mouth daily.   09/19/2021 at 0700   tolterodine (DETROL) 2 MG tablet Take 2 mg by mouth 2 (two) times daily.   09/19/2021 at 0700   traZODone (DESYREL) 50 MG tablet Take 50 mg by mouth at bedtime.   09/18/2021   vitamin B-12 (CYANOCOBALAMIN) 1000 MCG tablet Take 1,000 mcg by mouth daily.   Past Month   estrogens, conjugated, (PREMARIN) 0.45 MG tablet Take 0.45 mg by mouth daily. Take daily for 21 days then do not take for 7 days.      pantoprazole (PROTONIX) 40 MG tablet Take 40 mg by mouth daily.      valACYclovir (VALTREX) 500 MG tablet Take 500 mg by mouth 2 (two) times daily. (Patient not taking: Reported on 09/19/2021)   Not Taking     Allergies  Allergen Reactions   Ace Inhibitors     cough   Codeine Nausea Only     Past Medical History:  Diagnosis Date   Arteriovenous malformation of liver    B12 deficiency    Cervical disc disease    Depression    Hypertension    Osteoarthritis     Review of systems:  Otherwise negative.    Physical Exam  Gen: Alert, oriented. Appears stated age.  HEENT: PERRLA. Lungs: No respiratory distress CV: RRR Abd:  soft, benign, no masses Ext: No edema    Planned procedures: Proceed with colonoscopy. The patient understands the nature of the planned procedure, indications, risks, alternatives and potential complications including but not limited to bleeding, infection, perforation, damage to internal organs and possible oversedation/side effects from anesthesia. The patient agrees and gives consent to proceed.  Please refer to procedure notes for findings, recommendations and patient disposition/instructions.     Loretta Rubenstein, MD [facility]

## 2021-09-19 NOTE — Transfer of Care (Signed)
Immediate Anesthesia Transfer of Care Note  Patient: Loretta Ramsey  Procedure(s) Performed: COLONOSCOPY WITH PROPOFOL  Patient Location: PACU and Endoscopy Unit  Anesthesia Type:General  Level of Consciousness: drowsy  Airway & Oxygen Therapy: Patient Spontanous Breathing  Post-op Assessment: Report given to RN and Post -op Vital signs reviewed and stable  Post vital signs: Reviewed  Last Vitals:  Vitals Value Taken Time  BP 129/60 09/19/21 1257  Temp    Pulse 85 09/19/21 1257  Resp 13 09/19/21 1257  SpO2 100 % 09/19/21 1257    Last Pain:  Vitals:   09/19/21 1149  TempSrc: Temporal  PainSc: 0-No pain         Complications: No notable events documented.

## 2021-09-20 ENCOUNTER — Encounter: Payer: Self-pay | Admitting: Gastroenterology

## 2021-09-20 LAB — SURGICAL PATHOLOGY

## 2021-09-21 DIAGNOSIS — E538 Deficiency of other specified B group vitamins: Secondary | ICD-10-CM | POA: Diagnosis not present

## 2021-09-25 DIAGNOSIS — M9903 Segmental and somatic dysfunction of lumbar region: Secondary | ICD-10-CM | POA: Diagnosis not present

## 2021-09-25 DIAGNOSIS — M62830 Muscle spasm of back: Secondary | ICD-10-CM | POA: Diagnosis not present

## 2021-09-25 DIAGNOSIS — M9902 Segmental and somatic dysfunction of thoracic region: Secondary | ICD-10-CM | POA: Diagnosis not present

## 2021-09-25 DIAGNOSIS — M9901 Segmental and somatic dysfunction of cervical region: Secondary | ICD-10-CM | POA: Diagnosis not present

## 2021-10-04 DIAGNOSIS — M9901 Segmental and somatic dysfunction of cervical region: Secondary | ICD-10-CM | POA: Diagnosis not present

## 2021-10-04 DIAGNOSIS — M9902 Segmental and somatic dysfunction of thoracic region: Secondary | ICD-10-CM | POA: Diagnosis not present

## 2021-10-04 DIAGNOSIS — M9903 Segmental and somatic dysfunction of lumbar region: Secondary | ICD-10-CM | POA: Diagnosis not present

## 2021-10-04 DIAGNOSIS — M62830 Muscle spasm of back: Secondary | ICD-10-CM | POA: Diagnosis not present

## 2021-10-11 ENCOUNTER — Other Ambulatory Visit: Payer: Self-pay

## 2021-10-11 ENCOUNTER — Ambulatory Visit
Admission: RE | Admit: 2021-10-11 | Discharge: 2021-10-11 | Disposition: A | Discharge status: Home / Self Care | Payer: PPO | Source: Ambulatory Visit | Attending: Internal Medicine | Admitting: Internal Medicine

## 2021-10-11 DIAGNOSIS — Z1231 Encounter for screening mammogram for malignant neoplasm of breast: Secondary | ICD-10-CM | POA: Insufficient documentation

## 2021-10-18 DIAGNOSIS — M62830 Muscle spasm of back: Secondary | ICD-10-CM | POA: Diagnosis not present

## 2021-10-18 DIAGNOSIS — M9902 Segmental and somatic dysfunction of thoracic region: Secondary | ICD-10-CM | POA: Diagnosis not present

## 2021-10-18 DIAGNOSIS — M9903 Segmental and somatic dysfunction of lumbar region: Secondary | ICD-10-CM | POA: Diagnosis not present

## 2021-10-18 DIAGNOSIS — M9901 Segmental and somatic dysfunction of cervical region: Secondary | ICD-10-CM | POA: Diagnosis not present

## 2021-10-23 DIAGNOSIS — E538 Deficiency of other specified B group vitamins: Secondary | ICD-10-CM | POA: Diagnosis not present

## 2021-11-01 DIAGNOSIS — M9902 Segmental and somatic dysfunction of thoracic region: Secondary | ICD-10-CM | POA: Diagnosis not present

## 2021-11-01 DIAGNOSIS — M9903 Segmental and somatic dysfunction of lumbar region: Secondary | ICD-10-CM | POA: Diagnosis not present

## 2021-11-01 DIAGNOSIS — M62830 Muscle spasm of back: Secondary | ICD-10-CM | POA: Diagnosis not present

## 2021-11-01 DIAGNOSIS — M9901 Segmental and somatic dysfunction of cervical region: Secondary | ICD-10-CM | POA: Diagnosis not present

## 2021-11-27 DIAGNOSIS — E538 Deficiency of other specified B group vitamins: Secondary | ICD-10-CM | POA: Diagnosis not present

## 2021-12-06 DIAGNOSIS — M9902 Segmental and somatic dysfunction of thoracic region: Secondary | ICD-10-CM | POA: Diagnosis not present

## 2021-12-06 DIAGNOSIS — M9903 Segmental and somatic dysfunction of lumbar region: Secondary | ICD-10-CM | POA: Diagnosis not present

## 2021-12-06 DIAGNOSIS — M9901 Segmental and somatic dysfunction of cervical region: Secondary | ICD-10-CM | POA: Diagnosis not present

## 2021-12-06 DIAGNOSIS — M62830 Muscle spasm of back: Secondary | ICD-10-CM | POA: Diagnosis not present

## 2021-12-28 DIAGNOSIS — E538 Deficiency of other specified B group vitamins: Secondary | ICD-10-CM | POA: Diagnosis not present

## 2022-01-02 DIAGNOSIS — R151 Fecal smearing: Secondary | ICD-10-CM | POA: Diagnosis not present

## 2022-01-02 DIAGNOSIS — M5010 Cervical disc disorder with radiculopathy, unspecified cervical region: Secondary | ICD-10-CM | POA: Diagnosis not present

## 2022-01-02 DIAGNOSIS — F330 Major depressive disorder, recurrent, mild: Secondary | ICD-10-CM | POA: Diagnosis not present

## 2022-01-03 DIAGNOSIS — M62830 Muscle spasm of back: Secondary | ICD-10-CM | POA: Diagnosis not present

## 2022-01-03 DIAGNOSIS — M9901 Segmental and somatic dysfunction of cervical region: Secondary | ICD-10-CM | POA: Diagnosis not present

## 2022-01-03 DIAGNOSIS — M9902 Segmental and somatic dysfunction of thoracic region: Secondary | ICD-10-CM | POA: Diagnosis not present

## 2022-01-03 DIAGNOSIS — M9903 Segmental and somatic dysfunction of lumbar region: Secondary | ICD-10-CM | POA: Diagnosis not present

## 2022-01-08 DIAGNOSIS — H26493 Other secondary cataract, bilateral: Secondary | ICD-10-CM | POA: Diagnosis not present

## 2022-01-19 DIAGNOSIS — Z780 Asymptomatic menopausal state: Secondary | ICD-10-CM | POA: Diagnosis not present

## 2022-01-29 DIAGNOSIS — E538 Deficiency of other specified B group vitamins: Secondary | ICD-10-CM | POA: Diagnosis not present

## 2022-01-31 DIAGNOSIS — M9903 Segmental and somatic dysfunction of lumbar region: Secondary | ICD-10-CM | POA: Diagnosis not present

## 2022-01-31 DIAGNOSIS — M9902 Segmental and somatic dysfunction of thoracic region: Secondary | ICD-10-CM | POA: Diagnosis not present

## 2022-01-31 DIAGNOSIS — M62830 Muscle spasm of back: Secondary | ICD-10-CM | POA: Diagnosis not present

## 2022-01-31 DIAGNOSIS — M9901 Segmental and somatic dysfunction of cervical region: Secondary | ICD-10-CM | POA: Diagnosis not present

## 2022-02-01 DIAGNOSIS — D2272 Melanocytic nevi of left lower limb, including hip: Secondary | ICD-10-CM | POA: Diagnosis not present

## 2022-02-01 DIAGNOSIS — D2271 Melanocytic nevi of right lower limb, including hip: Secondary | ICD-10-CM | POA: Diagnosis not present

## 2022-02-01 DIAGNOSIS — D2261 Melanocytic nevi of right upper limb, including shoulder: Secondary | ICD-10-CM | POA: Diagnosis not present

## 2022-02-01 DIAGNOSIS — L821 Other seborrheic keratosis: Secondary | ICD-10-CM | POA: Diagnosis not present

## 2022-02-01 DIAGNOSIS — D225 Melanocytic nevi of trunk: Secondary | ICD-10-CM | POA: Diagnosis not present

## 2022-02-01 DIAGNOSIS — D2262 Melanocytic nevi of left upper limb, including shoulder: Secondary | ICD-10-CM | POA: Diagnosis not present

## 2022-02-06 DIAGNOSIS — B349 Viral infection, unspecified: Secondary | ICD-10-CM | POA: Diagnosis not present

## 2022-02-06 DIAGNOSIS — J45902 Unspecified asthma with status asthmaticus: Secondary | ICD-10-CM | POA: Diagnosis not present

## 2022-02-06 DIAGNOSIS — J40 Bronchitis, not specified as acute or chronic: Secondary | ICD-10-CM | POA: Diagnosis not present

## 2022-03-01 DIAGNOSIS — E538 Deficiency of other specified B group vitamins: Secondary | ICD-10-CM | POA: Diagnosis not present

## 2022-03-13 DIAGNOSIS — M9901 Segmental and somatic dysfunction of cervical region: Secondary | ICD-10-CM | POA: Diagnosis not present

## 2022-03-13 DIAGNOSIS — M9903 Segmental and somatic dysfunction of lumbar region: Secondary | ICD-10-CM | POA: Diagnosis not present

## 2022-03-13 DIAGNOSIS — M9902 Segmental and somatic dysfunction of thoracic region: Secondary | ICD-10-CM | POA: Diagnosis not present

## 2022-03-13 DIAGNOSIS — M62830 Muscle spasm of back: Secondary | ICD-10-CM | POA: Diagnosis not present

## 2022-04-02 DIAGNOSIS — E538 Deficiency of other specified B group vitamins: Secondary | ICD-10-CM | POA: Diagnosis not present

## 2022-04-04 DIAGNOSIS — M62830 Muscle spasm of back: Secondary | ICD-10-CM | POA: Diagnosis not present

## 2022-04-04 DIAGNOSIS — M9902 Segmental and somatic dysfunction of thoracic region: Secondary | ICD-10-CM | POA: Diagnosis not present

## 2022-04-04 DIAGNOSIS — M9903 Segmental and somatic dysfunction of lumbar region: Secondary | ICD-10-CM | POA: Diagnosis not present

## 2022-04-04 DIAGNOSIS — M9901 Segmental and somatic dysfunction of cervical region: Secondary | ICD-10-CM | POA: Diagnosis not present

## 2022-04-05 DIAGNOSIS — M79672 Pain in left foot: Secondary | ICD-10-CM | POA: Diagnosis not present

## 2022-04-05 DIAGNOSIS — M722 Plantar fascial fibromatosis: Secondary | ICD-10-CM | POA: Diagnosis not present

## 2022-04-11 DIAGNOSIS — S41101A Unspecified open wound of right upper arm, initial encounter: Secondary | ICD-10-CM | POA: Diagnosis not present

## 2022-04-11 DIAGNOSIS — W01198A Fall on same level from slipping, tripping and stumbling with subsequent striking against other object, initial encounter: Secondary | ICD-10-CM | POA: Diagnosis not present

## 2022-04-11 DIAGNOSIS — L03113 Cellulitis of right upper limb: Secondary | ICD-10-CM | POA: Diagnosis not present

## 2022-04-26 DIAGNOSIS — M79672 Pain in left foot: Secondary | ICD-10-CM | POA: Diagnosis not present

## 2022-04-26 DIAGNOSIS — M722 Plantar fascial fibromatosis: Secondary | ICD-10-CM | POA: Diagnosis not present

## 2022-05-02 DIAGNOSIS — M9901 Segmental and somatic dysfunction of cervical region: Secondary | ICD-10-CM | POA: Diagnosis not present

## 2022-05-02 DIAGNOSIS — M9903 Segmental and somatic dysfunction of lumbar region: Secondary | ICD-10-CM | POA: Diagnosis not present

## 2022-05-02 DIAGNOSIS — M9902 Segmental and somatic dysfunction of thoracic region: Secondary | ICD-10-CM | POA: Diagnosis not present

## 2022-05-02 DIAGNOSIS — M62830 Muscle spasm of back: Secondary | ICD-10-CM | POA: Diagnosis not present

## 2022-05-03 DIAGNOSIS — E538 Deficiency of other specified B group vitamins: Secondary | ICD-10-CM | POA: Diagnosis not present

## 2022-06-04 DIAGNOSIS — E538 Deficiency of other specified B group vitamins: Secondary | ICD-10-CM | POA: Diagnosis not present

## 2022-06-06 DIAGNOSIS — M9901 Segmental and somatic dysfunction of cervical region: Secondary | ICD-10-CM | POA: Diagnosis not present

## 2022-06-06 DIAGNOSIS — M9902 Segmental and somatic dysfunction of thoracic region: Secondary | ICD-10-CM | POA: Diagnosis not present

## 2022-06-06 DIAGNOSIS — M9903 Segmental and somatic dysfunction of lumbar region: Secondary | ICD-10-CM | POA: Diagnosis not present

## 2022-06-06 DIAGNOSIS — M62830 Muscle spasm of back: Secondary | ICD-10-CM | POA: Diagnosis not present

## 2022-06-25 DIAGNOSIS — M9903 Segmental and somatic dysfunction of lumbar region: Secondary | ICD-10-CM | POA: Diagnosis not present

## 2022-06-25 DIAGNOSIS — M9902 Segmental and somatic dysfunction of thoracic region: Secondary | ICD-10-CM | POA: Diagnosis not present

## 2022-06-25 DIAGNOSIS — M9901 Segmental and somatic dysfunction of cervical region: Secondary | ICD-10-CM | POA: Diagnosis not present

## 2022-06-25 DIAGNOSIS — M62830 Muscle spasm of back: Secondary | ICD-10-CM | POA: Diagnosis not present

## 2022-07-05 DIAGNOSIS — E538 Deficiency of other specified B group vitamins: Secondary | ICD-10-CM | POA: Diagnosis not present

## 2022-07-11 DIAGNOSIS — M9903 Segmental and somatic dysfunction of lumbar region: Secondary | ICD-10-CM | POA: Diagnosis not present

## 2022-07-11 DIAGNOSIS — M9901 Segmental and somatic dysfunction of cervical region: Secondary | ICD-10-CM | POA: Diagnosis not present

## 2022-07-11 DIAGNOSIS — M9902 Segmental and somatic dysfunction of thoracic region: Secondary | ICD-10-CM | POA: Diagnosis not present

## 2022-07-11 DIAGNOSIS — M62830 Muscle spasm of back: Secondary | ICD-10-CM | POA: Diagnosis not present

## 2022-08-01 DIAGNOSIS — M9903 Segmental and somatic dysfunction of lumbar region: Secondary | ICD-10-CM | POA: Diagnosis not present

## 2022-08-01 DIAGNOSIS — M9902 Segmental and somatic dysfunction of thoracic region: Secondary | ICD-10-CM | POA: Diagnosis not present

## 2022-08-01 DIAGNOSIS — M9901 Segmental and somatic dysfunction of cervical region: Secondary | ICD-10-CM | POA: Diagnosis not present

## 2022-08-01 DIAGNOSIS — M62830 Muscle spasm of back: Secondary | ICD-10-CM | POA: Diagnosis not present

## 2022-08-07 DIAGNOSIS — E538 Deficiency of other specified B group vitamins: Secondary | ICD-10-CM | POA: Diagnosis not present

## 2022-08-08 DIAGNOSIS — M5010 Cervical disc disorder with radiculopathy, unspecified cervical region: Secondary | ICD-10-CM | POA: Diagnosis not present

## 2022-08-08 DIAGNOSIS — F330 Major depressive disorder, recurrent, mild: Secondary | ICD-10-CM | POA: Diagnosis not present

## 2022-08-22 DIAGNOSIS — M62830 Muscle spasm of back: Secondary | ICD-10-CM | POA: Diagnosis not present

## 2022-08-22 DIAGNOSIS — M9903 Segmental and somatic dysfunction of lumbar region: Secondary | ICD-10-CM | POA: Diagnosis not present

## 2022-08-22 DIAGNOSIS — M9902 Segmental and somatic dysfunction of thoracic region: Secondary | ICD-10-CM | POA: Diagnosis not present

## 2022-08-22 DIAGNOSIS — M9901 Segmental and somatic dysfunction of cervical region: Secondary | ICD-10-CM | POA: Diagnosis not present

## 2022-09-05 DIAGNOSIS — L820 Inflamed seborrheic keratosis: Secondary | ICD-10-CM | POA: Diagnosis not present

## 2022-09-05 DIAGNOSIS — L298 Other pruritus: Secondary | ICD-10-CM | POA: Diagnosis not present

## 2022-09-05 DIAGNOSIS — L720 Epidermal cyst: Secondary | ICD-10-CM | POA: Diagnosis not present

## 2022-09-05 DIAGNOSIS — M9903 Segmental and somatic dysfunction of lumbar region: Secondary | ICD-10-CM | POA: Diagnosis not present

## 2022-09-05 DIAGNOSIS — M9901 Segmental and somatic dysfunction of cervical region: Secondary | ICD-10-CM | POA: Diagnosis not present

## 2022-09-05 DIAGNOSIS — L538 Other specified erythematous conditions: Secondary | ICD-10-CM | POA: Diagnosis not present

## 2022-09-05 DIAGNOSIS — M9902 Segmental and somatic dysfunction of thoracic region: Secondary | ICD-10-CM | POA: Diagnosis not present

## 2022-09-05 DIAGNOSIS — M62830 Muscle spasm of back: Secondary | ICD-10-CM | POA: Diagnosis not present

## 2022-09-11 DIAGNOSIS — E559 Vitamin D deficiency, unspecified: Secondary | ICD-10-CM | POA: Diagnosis not present

## 2022-09-11 DIAGNOSIS — E538 Deficiency of other specified B group vitamins: Secondary | ICD-10-CM | POA: Diagnosis not present

## 2022-09-11 DIAGNOSIS — E782 Mixed hyperlipidemia: Secondary | ICD-10-CM | POA: Diagnosis not present

## 2022-09-18 DIAGNOSIS — E782 Mixed hyperlipidemia: Secondary | ICD-10-CM | POA: Diagnosis not present

## 2022-09-18 DIAGNOSIS — E559 Vitamin D deficiency, unspecified: Secondary | ICD-10-CM | POA: Diagnosis not present

## 2022-09-18 DIAGNOSIS — Z0000 Encounter for general adult medical examination without abnormal findings: Secondary | ICD-10-CM | POA: Diagnosis not present

## 2022-09-18 DIAGNOSIS — F330 Major depressive disorder, recurrent, mild: Secondary | ICD-10-CM | POA: Diagnosis not present

## 2022-09-18 DIAGNOSIS — E538 Deficiency of other specified B group vitamins: Secondary | ICD-10-CM | POA: Diagnosis not present

## 2022-10-03 DIAGNOSIS — M9902 Segmental and somatic dysfunction of thoracic region: Secondary | ICD-10-CM | POA: Diagnosis not present

## 2022-10-03 DIAGNOSIS — M62830 Muscle spasm of back: Secondary | ICD-10-CM | POA: Diagnosis not present

## 2022-10-03 DIAGNOSIS — M9901 Segmental and somatic dysfunction of cervical region: Secondary | ICD-10-CM | POA: Diagnosis not present

## 2022-10-03 DIAGNOSIS — M9903 Segmental and somatic dysfunction of lumbar region: Secondary | ICD-10-CM | POA: Diagnosis not present

## 2022-10-12 DIAGNOSIS — E538 Deficiency of other specified B group vitamins: Secondary | ICD-10-CM | POA: Diagnosis not present

## 2022-10-16 ENCOUNTER — Other Ambulatory Visit: Payer: Self-pay | Admitting: Internal Medicine

## 2022-10-16 DIAGNOSIS — M25511 Pain in right shoulder: Secondary | ICD-10-CM | POA: Diagnosis not present

## 2022-10-16 DIAGNOSIS — M25512 Pain in left shoulder: Secondary | ICD-10-CM | POA: Diagnosis not present

## 2022-10-16 DIAGNOSIS — Z1231 Encounter for screening mammogram for malignant neoplasm of breast: Secondary | ICD-10-CM

## 2022-10-16 DIAGNOSIS — G8929 Other chronic pain: Secondary | ICD-10-CM | POA: Diagnosis not present

## 2022-10-23 DIAGNOSIS — M25512 Pain in left shoulder: Secondary | ICD-10-CM | POA: Diagnosis not present

## 2022-10-23 DIAGNOSIS — M25511 Pain in right shoulder: Secondary | ICD-10-CM | POA: Diagnosis not present

## 2022-10-23 DIAGNOSIS — G8929 Other chronic pain: Secondary | ICD-10-CM | POA: Diagnosis not present

## 2022-10-30 DIAGNOSIS — M25512 Pain in left shoulder: Secondary | ICD-10-CM | POA: Diagnosis not present

## 2022-10-30 DIAGNOSIS — G8929 Other chronic pain: Secondary | ICD-10-CM | POA: Diagnosis not present

## 2022-10-30 DIAGNOSIS — M25511 Pain in right shoulder: Secondary | ICD-10-CM | POA: Diagnosis not present

## 2022-10-31 DIAGNOSIS — M62830 Muscle spasm of back: Secondary | ICD-10-CM | POA: Diagnosis not present

## 2022-10-31 DIAGNOSIS — M9901 Segmental and somatic dysfunction of cervical region: Secondary | ICD-10-CM | POA: Diagnosis not present

## 2022-10-31 DIAGNOSIS — M9902 Segmental and somatic dysfunction of thoracic region: Secondary | ICD-10-CM | POA: Diagnosis not present

## 2022-10-31 DIAGNOSIS — M9903 Segmental and somatic dysfunction of lumbar region: Secondary | ICD-10-CM | POA: Diagnosis not present

## 2022-11-06 DIAGNOSIS — G8929 Other chronic pain: Secondary | ICD-10-CM | POA: Diagnosis not present

## 2022-11-06 DIAGNOSIS — M25512 Pain in left shoulder: Secondary | ICD-10-CM | POA: Diagnosis not present

## 2022-11-06 DIAGNOSIS — M25511 Pain in right shoulder: Secondary | ICD-10-CM | POA: Diagnosis not present

## 2022-11-13 DIAGNOSIS — M25511 Pain in right shoulder: Secondary | ICD-10-CM | POA: Diagnosis not present

## 2022-11-13 DIAGNOSIS — Z23 Encounter for immunization: Secondary | ICD-10-CM | POA: Diagnosis not present

## 2022-11-13 DIAGNOSIS — M25512 Pain in left shoulder: Secondary | ICD-10-CM | POA: Diagnosis not present

## 2022-11-13 DIAGNOSIS — E538 Deficiency of other specified B group vitamins: Secondary | ICD-10-CM | POA: Diagnosis not present

## 2022-11-13 DIAGNOSIS — G8929 Other chronic pain: Secondary | ICD-10-CM | POA: Diagnosis not present

## 2022-11-16 ENCOUNTER — Ambulatory Visit
Admission: RE | Admit: 2022-11-16 | Discharge: 2022-11-16 | Disposition: A | Discharge status: Home / Self Care | Payer: PPO | Source: Ambulatory Visit | Attending: Internal Medicine | Admitting: Internal Medicine

## 2022-11-16 DIAGNOSIS — Z1231 Encounter for screening mammogram for malignant neoplasm of breast: Secondary | ICD-10-CM | POA: Diagnosis not present

## 2022-12-05 DIAGNOSIS — M9902 Segmental and somatic dysfunction of thoracic region: Secondary | ICD-10-CM | POA: Diagnosis not present

## 2022-12-05 DIAGNOSIS — M62830 Muscle spasm of back: Secondary | ICD-10-CM | POA: Diagnosis not present

## 2022-12-05 DIAGNOSIS — M9903 Segmental and somatic dysfunction of lumbar region: Secondary | ICD-10-CM | POA: Diagnosis not present

## 2022-12-05 DIAGNOSIS — M9901 Segmental and somatic dysfunction of cervical region: Secondary | ICD-10-CM | POA: Diagnosis not present

## 2022-12-14 DIAGNOSIS — E538 Deficiency of other specified B group vitamins: Secondary | ICD-10-CM | POA: Diagnosis not present

## 2023-01-02 DIAGNOSIS — M9903 Segmental and somatic dysfunction of lumbar region: Secondary | ICD-10-CM | POA: Diagnosis not present

## 2023-01-02 DIAGNOSIS — M9902 Segmental and somatic dysfunction of thoracic region: Secondary | ICD-10-CM | POA: Diagnosis not present

## 2023-01-02 DIAGNOSIS — M9901 Segmental and somatic dysfunction of cervical region: Secondary | ICD-10-CM | POA: Diagnosis not present

## 2023-01-02 DIAGNOSIS — M62830 Muscle spasm of back: Secondary | ICD-10-CM | POA: Diagnosis not present

## 2023-01-14 DIAGNOSIS — E538 Deficiency of other specified B group vitamins: Secondary | ICD-10-CM | POA: Diagnosis not present

## 2023-01-25 DIAGNOSIS — R159 Full incontinence of feces: Secondary | ICD-10-CM | POA: Diagnosis not present

## 2023-01-28 DIAGNOSIS — M546 Pain in thoracic spine: Secondary | ICD-10-CM | POA: Diagnosis not present

## 2023-01-28 DIAGNOSIS — M5412 Radiculopathy, cervical region: Secondary | ICD-10-CM | POA: Diagnosis not present

## 2023-01-28 DIAGNOSIS — M9902 Segmental and somatic dysfunction of thoracic region: Secondary | ICD-10-CM | POA: Diagnosis not present

## 2023-01-28 DIAGNOSIS — M9901 Segmental and somatic dysfunction of cervical region: Secondary | ICD-10-CM | POA: Diagnosis not present

## 2023-02-05 DIAGNOSIS — R140 Abdominal distension (gaseous): Secondary | ICD-10-CM | POA: Diagnosis not present

## 2023-02-05 DIAGNOSIS — K5909 Other constipation: Secondary | ICD-10-CM | POA: Diagnosis not present

## 2023-02-05 DIAGNOSIS — R110 Nausea: Secondary | ICD-10-CM | POA: Diagnosis not present

## 2023-02-13 DIAGNOSIS — M9902 Segmental and somatic dysfunction of thoracic region: Secondary | ICD-10-CM | POA: Diagnosis not present

## 2023-02-13 DIAGNOSIS — M9901 Segmental and somatic dysfunction of cervical region: Secondary | ICD-10-CM | POA: Diagnosis not present

## 2023-02-13 DIAGNOSIS — M5412 Radiculopathy, cervical region: Secondary | ICD-10-CM | POA: Diagnosis not present

## 2023-02-13 DIAGNOSIS — M546 Pain in thoracic spine: Secondary | ICD-10-CM | POA: Diagnosis not present

## 2023-02-14 DIAGNOSIS — E538 Deficiency of other specified B group vitamins: Secondary | ICD-10-CM | POA: Diagnosis not present

## 2023-02-27 DIAGNOSIS — M5412 Radiculopathy, cervical region: Secondary | ICD-10-CM | POA: Diagnosis not present

## 2023-02-27 DIAGNOSIS — M9901 Segmental and somatic dysfunction of cervical region: Secondary | ICD-10-CM | POA: Diagnosis not present

## 2023-02-27 DIAGNOSIS — M9902 Segmental and somatic dysfunction of thoracic region: Secondary | ICD-10-CM | POA: Diagnosis not present

## 2023-02-27 DIAGNOSIS — M546 Pain in thoracic spine: Secondary | ICD-10-CM | POA: Diagnosis not present

## 2023-03-04 DIAGNOSIS — H1045 Other chronic allergic conjunctivitis: Secondary | ICD-10-CM | POA: Diagnosis not present

## 2023-03-04 DIAGNOSIS — H26493 Other secondary cataract, bilateral: Secondary | ICD-10-CM | POA: Diagnosis not present

## 2023-03-19 DIAGNOSIS — E538 Deficiency of other specified B group vitamins: Secondary | ICD-10-CM | POA: Diagnosis not present

## 2023-04-02 DIAGNOSIS — M546 Pain in thoracic spine: Secondary | ICD-10-CM | POA: Diagnosis not present

## 2023-04-02 DIAGNOSIS — M5412 Radiculopathy, cervical region: Secondary | ICD-10-CM | POA: Diagnosis not present

## 2023-04-02 DIAGNOSIS — M9902 Segmental and somatic dysfunction of thoracic region: Secondary | ICD-10-CM | POA: Diagnosis not present

## 2023-04-02 DIAGNOSIS — M9901 Segmental and somatic dysfunction of cervical region: Secondary | ICD-10-CM | POA: Diagnosis not present

## 2023-04-11 DIAGNOSIS — M722 Plantar fascial fibromatosis: Secondary | ICD-10-CM | POA: Diagnosis not present

## 2023-04-11 DIAGNOSIS — M79672 Pain in left foot: Secondary | ICD-10-CM | POA: Diagnosis not present

## 2023-04-15 DIAGNOSIS — M9901 Segmental and somatic dysfunction of cervical region: Secondary | ICD-10-CM | POA: Diagnosis not present

## 2023-04-15 DIAGNOSIS — M5412 Radiculopathy, cervical region: Secondary | ICD-10-CM | POA: Diagnosis not present

## 2023-04-15 DIAGNOSIS — M546 Pain in thoracic spine: Secondary | ICD-10-CM | POA: Diagnosis not present

## 2023-04-15 DIAGNOSIS — M9902 Segmental and somatic dysfunction of thoracic region: Secondary | ICD-10-CM | POA: Diagnosis not present

## 2023-04-19 DIAGNOSIS — E538 Deficiency of other specified B group vitamins: Secondary | ICD-10-CM | POA: Diagnosis not present

## 2023-04-29 DIAGNOSIS — M546 Pain in thoracic spine: Secondary | ICD-10-CM | POA: Diagnosis not present

## 2023-04-29 DIAGNOSIS — M9901 Segmental and somatic dysfunction of cervical region: Secondary | ICD-10-CM | POA: Diagnosis not present

## 2023-04-29 DIAGNOSIS — M9902 Segmental and somatic dysfunction of thoracic region: Secondary | ICD-10-CM | POA: Diagnosis not present

## 2023-04-29 DIAGNOSIS — M5412 Radiculopathy, cervical region: Secondary | ICD-10-CM | POA: Diagnosis not present

## 2023-05-14 DIAGNOSIS — M546 Pain in thoracic spine: Secondary | ICD-10-CM | POA: Diagnosis not present

## 2023-05-14 DIAGNOSIS — M9902 Segmental and somatic dysfunction of thoracic region: Secondary | ICD-10-CM | POA: Diagnosis not present

## 2023-05-14 DIAGNOSIS — M9901 Segmental and somatic dysfunction of cervical region: Secondary | ICD-10-CM | POA: Diagnosis not present

## 2023-05-14 DIAGNOSIS — M5412 Radiculopathy, cervical region: Secondary | ICD-10-CM | POA: Diagnosis not present

## 2023-06-03 DIAGNOSIS — K5909 Other constipation: Secondary | ICD-10-CM | POA: Diagnosis not present

## 2023-06-04 DIAGNOSIS — M546 Pain in thoracic spine: Secondary | ICD-10-CM | POA: Diagnosis not present

## 2023-06-04 DIAGNOSIS — M5412 Radiculopathy, cervical region: Secondary | ICD-10-CM | POA: Diagnosis not present

## 2023-06-04 DIAGNOSIS — M9901 Segmental and somatic dysfunction of cervical region: Secondary | ICD-10-CM | POA: Diagnosis not present

## 2023-06-04 DIAGNOSIS — M9902 Segmental and somatic dysfunction of thoracic region: Secondary | ICD-10-CM | POA: Diagnosis not present

## 2023-06-10 DIAGNOSIS — M5412 Radiculopathy, cervical region: Secondary | ICD-10-CM | POA: Diagnosis not present

## 2023-06-10 DIAGNOSIS — M546 Pain in thoracic spine: Secondary | ICD-10-CM | POA: Diagnosis not present

## 2023-06-10 DIAGNOSIS — M9901 Segmental and somatic dysfunction of cervical region: Secondary | ICD-10-CM | POA: Diagnosis not present

## 2023-06-10 DIAGNOSIS — M9902 Segmental and somatic dysfunction of thoracic region: Secondary | ICD-10-CM | POA: Diagnosis not present

## 2023-06-18 DIAGNOSIS — M546 Pain in thoracic spine: Secondary | ICD-10-CM | POA: Diagnosis not present

## 2023-06-18 DIAGNOSIS — M9901 Segmental and somatic dysfunction of cervical region: Secondary | ICD-10-CM | POA: Diagnosis not present

## 2023-06-18 DIAGNOSIS — M9902 Segmental and somatic dysfunction of thoracic region: Secondary | ICD-10-CM | POA: Diagnosis not present

## 2023-06-18 DIAGNOSIS — M5412 Radiculopathy, cervical region: Secondary | ICD-10-CM | POA: Diagnosis not present

## 2023-07-02 DIAGNOSIS — M5412 Radiculopathy, cervical region: Secondary | ICD-10-CM | POA: Diagnosis not present

## 2023-07-02 DIAGNOSIS — M546 Pain in thoracic spine: Secondary | ICD-10-CM | POA: Diagnosis not present

## 2023-07-02 DIAGNOSIS — M9902 Segmental and somatic dysfunction of thoracic region: Secondary | ICD-10-CM | POA: Diagnosis not present

## 2023-07-02 DIAGNOSIS — M9901 Segmental and somatic dysfunction of cervical region: Secondary | ICD-10-CM | POA: Diagnosis not present

## 2023-07-24 DIAGNOSIS — M546 Pain in thoracic spine: Secondary | ICD-10-CM | POA: Diagnosis not present

## 2023-07-24 DIAGNOSIS — M5412 Radiculopathy, cervical region: Secondary | ICD-10-CM | POA: Diagnosis not present

## 2023-07-24 DIAGNOSIS — M9901 Segmental and somatic dysfunction of cervical region: Secondary | ICD-10-CM | POA: Diagnosis not present

## 2023-07-24 DIAGNOSIS — M9902 Segmental and somatic dysfunction of thoracic region: Secondary | ICD-10-CM | POA: Diagnosis not present

## 2023-08-14 DIAGNOSIS — M9901 Segmental and somatic dysfunction of cervical region: Secondary | ICD-10-CM | POA: Diagnosis not present

## 2023-08-14 DIAGNOSIS — M5412 Radiculopathy, cervical region: Secondary | ICD-10-CM | POA: Diagnosis not present

## 2023-08-14 DIAGNOSIS — M546 Pain in thoracic spine: Secondary | ICD-10-CM | POA: Diagnosis not present

## 2023-08-14 DIAGNOSIS — M9902 Segmental and somatic dysfunction of thoracic region: Secondary | ICD-10-CM | POA: Diagnosis not present

## 2023-08-19 DIAGNOSIS — M5412 Radiculopathy, cervical region: Secondary | ICD-10-CM | POA: Diagnosis not present

## 2023-08-19 DIAGNOSIS — M9901 Segmental and somatic dysfunction of cervical region: Secondary | ICD-10-CM | POA: Diagnosis not present

## 2023-08-19 DIAGNOSIS — M546 Pain in thoracic spine: Secondary | ICD-10-CM | POA: Diagnosis not present

## 2023-08-19 DIAGNOSIS — M9902 Segmental and somatic dysfunction of thoracic region: Secondary | ICD-10-CM | POA: Diagnosis not present

## 2023-09-03 DIAGNOSIS — M9902 Segmental and somatic dysfunction of thoracic region: Secondary | ICD-10-CM | POA: Diagnosis not present

## 2023-09-03 DIAGNOSIS — M546 Pain in thoracic spine: Secondary | ICD-10-CM | POA: Diagnosis not present

## 2023-09-03 DIAGNOSIS — M5412 Radiculopathy, cervical region: Secondary | ICD-10-CM | POA: Diagnosis not present

## 2023-09-03 DIAGNOSIS — M9901 Segmental and somatic dysfunction of cervical region: Secondary | ICD-10-CM | POA: Diagnosis not present

## 2023-09-17 DIAGNOSIS — E782 Mixed hyperlipidemia: Secondary | ICD-10-CM | POA: Diagnosis not present

## 2023-09-17 DIAGNOSIS — E559 Vitamin D deficiency, unspecified: Secondary | ICD-10-CM | POA: Diagnosis not present

## 2023-09-17 DIAGNOSIS — E538 Deficiency of other specified B group vitamins: Secondary | ICD-10-CM | POA: Diagnosis not present

## 2023-09-18 DIAGNOSIS — D171 Benign lipomatous neoplasm of skin and subcutaneous tissue of trunk: Secondary | ICD-10-CM | POA: Diagnosis not present

## 2023-09-18 DIAGNOSIS — D2261 Melanocytic nevi of right upper limb, including shoulder: Secondary | ICD-10-CM | POA: Diagnosis not present

## 2023-09-18 DIAGNOSIS — D225 Melanocytic nevi of trunk: Secondary | ICD-10-CM | POA: Diagnosis not present

## 2023-09-18 DIAGNOSIS — D2272 Melanocytic nevi of left lower limb, including hip: Secondary | ICD-10-CM | POA: Diagnosis not present

## 2023-09-18 DIAGNOSIS — D2271 Melanocytic nevi of right lower limb, including hip: Secondary | ICD-10-CM | POA: Diagnosis not present

## 2023-09-18 DIAGNOSIS — D2262 Melanocytic nevi of left upper limb, including shoulder: Secondary | ICD-10-CM | POA: Diagnosis not present

## 2023-09-18 DIAGNOSIS — L821 Other seborrheic keratosis: Secondary | ICD-10-CM | POA: Diagnosis not present

## 2023-09-18 DIAGNOSIS — L300 Nummular dermatitis: Secondary | ICD-10-CM | POA: Diagnosis not present

## 2023-09-24 DIAGNOSIS — M9902 Segmental and somatic dysfunction of thoracic region: Secondary | ICD-10-CM | POA: Diagnosis not present

## 2023-09-24 DIAGNOSIS — Q2733 Arteriovenous malformation of digestive system vessel: Secondary | ICD-10-CM | POA: Diagnosis not present

## 2023-09-24 DIAGNOSIS — F330 Major depressive disorder, recurrent, mild: Secondary | ICD-10-CM | POA: Diagnosis not present

## 2023-09-24 DIAGNOSIS — E782 Mixed hyperlipidemia: Secondary | ICD-10-CM | POA: Diagnosis not present

## 2023-09-24 DIAGNOSIS — Z23 Encounter for immunization: Secondary | ICD-10-CM | POA: Diagnosis not present

## 2023-09-24 DIAGNOSIS — M546 Pain in thoracic spine: Secondary | ICD-10-CM | POA: Diagnosis not present

## 2023-09-24 DIAGNOSIS — M5412 Radiculopathy, cervical region: Secondary | ICD-10-CM | POA: Diagnosis not present

## 2023-09-24 DIAGNOSIS — M9901 Segmental and somatic dysfunction of cervical region: Secondary | ICD-10-CM | POA: Diagnosis not present

## 2023-09-24 DIAGNOSIS — Z0000 Encounter for general adult medical examination without abnormal findings: Secondary | ICD-10-CM | POA: Diagnosis not present

## 2023-09-24 DIAGNOSIS — E538 Deficiency of other specified B group vitamins: Secondary | ICD-10-CM | POA: Diagnosis not present

## 2023-10-15 DIAGNOSIS — M546 Pain in thoracic spine: Secondary | ICD-10-CM | POA: Diagnosis not present

## 2023-10-15 DIAGNOSIS — M9902 Segmental and somatic dysfunction of thoracic region: Secondary | ICD-10-CM | POA: Diagnosis not present

## 2023-10-15 DIAGNOSIS — M5412 Radiculopathy, cervical region: Secondary | ICD-10-CM | POA: Diagnosis not present

## 2023-10-15 DIAGNOSIS — M9901 Segmental and somatic dysfunction of cervical region: Secondary | ICD-10-CM | POA: Diagnosis not present

## 2023-10-28 ENCOUNTER — Other Ambulatory Visit: Payer: Self-pay | Admitting: Internal Medicine

## 2023-10-28 DIAGNOSIS — Z1231 Encounter for screening mammogram for malignant neoplasm of breast: Secondary | ICD-10-CM

## 2023-11-05 DIAGNOSIS — M9901 Segmental and somatic dysfunction of cervical region: Secondary | ICD-10-CM | POA: Diagnosis not present

## 2023-11-05 DIAGNOSIS — M9902 Segmental and somatic dysfunction of thoracic region: Secondary | ICD-10-CM | POA: Diagnosis not present

## 2023-11-05 DIAGNOSIS — M5412 Radiculopathy, cervical region: Secondary | ICD-10-CM | POA: Diagnosis not present

## 2023-11-05 DIAGNOSIS — M546 Pain in thoracic spine: Secondary | ICD-10-CM | POA: Diagnosis not present

## 2023-11-12 DIAGNOSIS — M9902 Segmental and somatic dysfunction of thoracic region: Secondary | ICD-10-CM | POA: Diagnosis not present

## 2023-11-12 DIAGNOSIS — M9901 Segmental and somatic dysfunction of cervical region: Secondary | ICD-10-CM | POA: Diagnosis not present

## 2023-11-12 DIAGNOSIS — M5412 Radiculopathy, cervical region: Secondary | ICD-10-CM | POA: Diagnosis not present

## 2023-11-12 DIAGNOSIS — M546 Pain in thoracic spine: Secondary | ICD-10-CM | POA: Diagnosis not present

## 2023-11-21 ENCOUNTER — Ambulatory Visit
Admission: RE | Admit: 2023-11-21 | Discharge: 2023-11-21 | Disposition: A | Discharge status: Home / Self Care | Payer: PPO | Source: Ambulatory Visit | Attending: Internal Medicine | Admitting: Internal Medicine

## 2023-11-21 DIAGNOSIS — Z1231 Encounter for screening mammogram for malignant neoplasm of breast: Secondary | ICD-10-CM | POA: Insufficient documentation

## 2023-11-27 ENCOUNTER — Other Ambulatory Visit: Payer: Self-pay | Admitting: Internal Medicine

## 2023-11-27 DIAGNOSIS — R921 Mammographic calcification found on diagnostic imaging of breast: Secondary | ICD-10-CM

## 2023-11-27 DIAGNOSIS — R928 Other abnormal and inconclusive findings on diagnostic imaging of breast: Secondary | ICD-10-CM

## 2023-12-03 ENCOUNTER — Ambulatory Visit
Admission: RE | Admit: 2023-12-03 | Discharge: 2023-12-03 | Disposition: A | Discharge status: Home / Self Care | Payer: PPO | Source: Ambulatory Visit | Attending: Internal Medicine | Admitting: Internal Medicine

## 2023-12-03 DIAGNOSIS — R921 Mammographic calcification found on diagnostic imaging of breast: Secondary | ICD-10-CM | POA: Insufficient documentation

## 2023-12-03 DIAGNOSIS — R928 Other abnormal and inconclusive findings on diagnostic imaging of breast: Secondary | ICD-10-CM | POA: Insufficient documentation

## 2023-12-03 DIAGNOSIS — R92343 Mammographic extreme density, bilateral breasts: Secondary | ICD-10-CM | POA: Diagnosis not present

## 2023-12-09 DIAGNOSIS — M546 Pain in thoracic spine: Secondary | ICD-10-CM | POA: Diagnosis not present

## 2023-12-09 DIAGNOSIS — M9901 Segmental and somatic dysfunction of cervical region: Secondary | ICD-10-CM | POA: Diagnosis not present

## 2023-12-09 DIAGNOSIS — M5412 Radiculopathy, cervical region: Secondary | ICD-10-CM | POA: Diagnosis not present

## 2023-12-09 DIAGNOSIS — M9902 Segmental and somatic dysfunction of thoracic region: Secondary | ICD-10-CM | POA: Diagnosis not present

## 2023-12-30 DIAGNOSIS — M5412 Radiculopathy, cervical region: Secondary | ICD-10-CM | POA: Diagnosis not present

## 2023-12-30 DIAGNOSIS — M546 Pain in thoracic spine: Secondary | ICD-10-CM | POA: Diagnosis not present

## 2023-12-30 DIAGNOSIS — M9902 Segmental and somatic dysfunction of thoracic region: Secondary | ICD-10-CM | POA: Diagnosis not present

## 2023-12-30 DIAGNOSIS — M9901 Segmental and somatic dysfunction of cervical region: Secondary | ICD-10-CM | POA: Diagnosis not present

## 2024-01-06 DIAGNOSIS — M546 Pain in thoracic spine: Secondary | ICD-10-CM | POA: Diagnosis not present

## 2024-01-06 DIAGNOSIS — M9901 Segmental and somatic dysfunction of cervical region: Secondary | ICD-10-CM | POA: Diagnosis not present

## 2024-01-06 DIAGNOSIS — M5412 Radiculopathy, cervical region: Secondary | ICD-10-CM | POA: Diagnosis not present

## 2024-01-06 DIAGNOSIS — M9902 Segmental and somatic dysfunction of thoracic region: Secondary | ICD-10-CM | POA: Diagnosis not present

## 2024-01-09 DIAGNOSIS — M7742 Metatarsalgia, left foot: Secondary | ICD-10-CM | POA: Diagnosis not present

## 2024-01-09 DIAGNOSIS — M7741 Metatarsalgia, right foot: Secondary | ICD-10-CM | POA: Diagnosis not present

## 2024-01-09 DIAGNOSIS — M79672 Pain in left foot: Secondary | ICD-10-CM | POA: Diagnosis not present

## 2024-01-09 DIAGNOSIS — M722 Plantar fascial fibromatosis: Secondary | ICD-10-CM | POA: Diagnosis not present

## 2024-01-09 DIAGNOSIS — R768 Other specified abnormal immunological findings in serum: Secondary | ICD-10-CM | POA: Diagnosis not present

## 2024-01-09 DIAGNOSIS — M65972 Unspecified synovitis and tenosynovitis, left ankle and foot: Secondary | ICD-10-CM | POA: Diagnosis not present

## 2024-01-27 DIAGNOSIS — M9901 Segmental and somatic dysfunction of cervical region: Secondary | ICD-10-CM | POA: Diagnosis not present

## 2024-01-27 DIAGNOSIS — M9902 Segmental and somatic dysfunction of thoracic region: Secondary | ICD-10-CM | POA: Diagnosis not present

## 2024-01-27 DIAGNOSIS — M546 Pain in thoracic spine: Secondary | ICD-10-CM | POA: Diagnosis not present

## 2024-01-27 DIAGNOSIS — M5412 Radiculopathy, cervical region: Secondary | ICD-10-CM | POA: Diagnosis not present

## 2024-01-30 DIAGNOSIS — L817 Pigmented purpuric dermatosis: Secondary | ICD-10-CM | POA: Diagnosis not present

## 2024-01-30 DIAGNOSIS — L820 Inflamed seborrheic keratosis: Secondary | ICD-10-CM | POA: Diagnosis not present

## 2024-01-30 DIAGNOSIS — L538 Other specified erythematous conditions: Secondary | ICD-10-CM | POA: Diagnosis not present

## 2024-01-30 DIAGNOSIS — D485 Neoplasm of uncertain behavior of skin: Secondary | ICD-10-CM | POA: Diagnosis not present

## 2024-01-30 DIAGNOSIS — R208 Other disturbances of skin sensation: Secondary | ICD-10-CM | POA: Diagnosis not present

## 2024-01-30 DIAGNOSIS — L821 Other seborrheic keratosis: Secondary | ICD-10-CM | POA: Diagnosis not present

## 2024-01-30 DIAGNOSIS — L218 Other seborrheic dermatitis: Secondary | ICD-10-CM | POA: Diagnosis not present

## 2024-02-18 DIAGNOSIS — M546 Pain in thoracic spine: Secondary | ICD-10-CM | POA: Diagnosis not present

## 2024-02-18 DIAGNOSIS — M5412 Radiculopathy, cervical region: Secondary | ICD-10-CM | POA: Diagnosis not present

## 2024-02-18 DIAGNOSIS — M9901 Segmental and somatic dysfunction of cervical region: Secondary | ICD-10-CM | POA: Diagnosis not present

## 2024-02-18 DIAGNOSIS — M9902 Segmental and somatic dysfunction of thoracic region: Secondary | ICD-10-CM | POA: Diagnosis not present

## 2024-03-05 DIAGNOSIS — H26493 Other secondary cataract, bilateral: Secondary | ICD-10-CM | POA: Diagnosis not present

## 2024-03-05 DIAGNOSIS — H1045 Other chronic allergic conjunctivitis: Secondary | ICD-10-CM | POA: Diagnosis not present

## 2024-03-10 DIAGNOSIS — M9901 Segmental and somatic dysfunction of cervical region: Secondary | ICD-10-CM | POA: Diagnosis not present

## 2024-03-10 DIAGNOSIS — M9902 Segmental and somatic dysfunction of thoracic region: Secondary | ICD-10-CM | POA: Diagnosis not present

## 2024-03-10 DIAGNOSIS — M5412 Radiculopathy, cervical region: Secondary | ICD-10-CM | POA: Diagnosis not present

## 2024-03-10 DIAGNOSIS — M546 Pain in thoracic spine: Secondary | ICD-10-CM | POA: Diagnosis not present

## 2024-03-31 DIAGNOSIS — M9902 Segmental and somatic dysfunction of thoracic region: Secondary | ICD-10-CM | POA: Diagnosis not present

## 2024-03-31 DIAGNOSIS — M9901 Segmental and somatic dysfunction of cervical region: Secondary | ICD-10-CM | POA: Diagnosis not present

## 2024-03-31 DIAGNOSIS — M546 Pain in thoracic spine: Secondary | ICD-10-CM | POA: Diagnosis not present

## 2024-03-31 DIAGNOSIS — M5412 Radiculopathy, cervical region: Secondary | ICD-10-CM | POA: Diagnosis not present

## 2024-04-20 DIAGNOSIS — M5412 Radiculopathy, cervical region: Secondary | ICD-10-CM | POA: Diagnosis not present

## 2024-04-20 DIAGNOSIS — M546 Pain in thoracic spine: Secondary | ICD-10-CM | POA: Diagnosis not present

## 2024-04-20 DIAGNOSIS — M9901 Segmental and somatic dysfunction of cervical region: Secondary | ICD-10-CM | POA: Diagnosis not present

## 2024-04-20 DIAGNOSIS — M9902 Segmental and somatic dysfunction of thoracic region: Secondary | ICD-10-CM | POA: Diagnosis not present

## 2024-05-05 DIAGNOSIS — F339 Major depressive disorder, recurrent, unspecified: Secondary | ICD-10-CM | POA: Diagnosis not present

## 2024-05-05 DIAGNOSIS — R21 Rash and other nonspecific skin eruption: Secondary | ICD-10-CM | POA: Diagnosis not present

## 2024-05-05 DIAGNOSIS — F330 Major depressive disorder, recurrent, mild: Secondary | ICD-10-CM | POA: Diagnosis not present

## 2024-05-06 DIAGNOSIS — M546 Pain in thoracic spine: Secondary | ICD-10-CM | POA: Diagnosis not present

## 2024-05-06 DIAGNOSIS — M9902 Segmental and somatic dysfunction of thoracic region: Secondary | ICD-10-CM | POA: Diagnosis not present

## 2024-05-06 DIAGNOSIS — M9901 Segmental and somatic dysfunction of cervical region: Secondary | ICD-10-CM | POA: Diagnosis not present

## 2024-05-06 DIAGNOSIS — M5412 Radiculopathy, cervical region: Secondary | ICD-10-CM | POA: Diagnosis not present

## 2024-05-19 DIAGNOSIS — L817 Pigmented purpuric dermatosis: Secondary | ICD-10-CM | POA: Diagnosis not present

## 2024-05-19 DIAGNOSIS — T490X5A Adverse effect of local antifungal, anti-infective and anti-inflammatory drugs, initial encounter: Secondary | ICD-10-CM | POA: Diagnosis not present

## 2024-05-19 DIAGNOSIS — L821 Other seborrheic keratosis: Secondary | ICD-10-CM | POA: Diagnosis not present

## 2024-05-20 DIAGNOSIS — M9902 Segmental and somatic dysfunction of thoracic region: Secondary | ICD-10-CM | POA: Diagnosis not present

## 2024-05-20 DIAGNOSIS — M5412 Radiculopathy, cervical region: Secondary | ICD-10-CM | POA: Diagnosis not present

## 2024-05-20 DIAGNOSIS — M546 Pain in thoracic spine: Secondary | ICD-10-CM | POA: Diagnosis not present

## 2024-05-20 DIAGNOSIS — M9901 Segmental and somatic dysfunction of cervical region: Secondary | ICD-10-CM | POA: Diagnosis not present

## 2024-06-01 DIAGNOSIS — M9901 Segmental and somatic dysfunction of cervical region: Secondary | ICD-10-CM | POA: Diagnosis not present

## 2024-06-01 DIAGNOSIS — M546 Pain in thoracic spine: Secondary | ICD-10-CM | POA: Diagnosis not present

## 2024-06-01 DIAGNOSIS — M9902 Segmental and somatic dysfunction of thoracic region: Secondary | ICD-10-CM | POA: Diagnosis not present

## 2024-06-01 DIAGNOSIS — M5412 Radiculopathy, cervical region: Secondary | ICD-10-CM | POA: Diagnosis not present

## 2024-06-04 DIAGNOSIS — M9901 Segmental and somatic dysfunction of cervical region: Secondary | ICD-10-CM | POA: Diagnosis not present

## 2024-06-04 DIAGNOSIS — M62830 Muscle spasm of back: Secondary | ICD-10-CM | POA: Diagnosis not present

## 2024-06-04 DIAGNOSIS — M9902 Segmental and somatic dysfunction of thoracic region: Secondary | ICD-10-CM | POA: Diagnosis not present

## 2024-06-04 DIAGNOSIS — M9903 Segmental and somatic dysfunction of lumbar region: Secondary | ICD-10-CM | POA: Diagnosis not present

## 2024-06-05 DIAGNOSIS — M5010 Cervical disc disorder with radiculopathy, unspecified cervical region: Secondary | ICD-10-CM | POA: Diagnosis not present

## 2024-06-05 DIAGNOSIS — F339 Major depressive disorder, recurrent, unspecified: Secondary | ICD-10-CM | POA: Diagnosis not present

## 2024-06-24 DIAGNOSIS — M546 Pain in thoracic spine: Secondary | ICD-10-CM | POA: Diagnosis not present

## 2024-06-24 DIAGNOSIS — M9901 Segmental and somatic dysfunction of cervical region: Secondary | ICD-10-CM | POA: Diagnosis not present

## 2024-06-24 DIAGNOSIS — M9902 Segmental and somatic dysfunction of thoracic region: Secondary | ICD-10-CM | POA: Diagnosis not present

## 2024-06-24 DIAGNOSIS — M5412 Radiculopathy, cervical region: Secondary | ICD-10-CM | POA: Diagnosis not present

## 2024-08-03 DIAGNOSIS — M9901 Segmental and somatic dysfunction of cervical region: Secondary | ICD-10-CM | POA: Diagnosis not present

## 2024-08-03 DIAGNOSIS — M5412 Radiculopathy, cervical region: Secondary | ICD-10-CM | POA: Diagnosis not present

## 2024-08-03 DIAGNOSIS — M9902 Segmental and somatic dysfunction of thoracic region: Secondary | ICD-10-CM | POA: Diagnosis not present

## 2024-08-03 DIAGNOSIS — M546 Pain in thoracic spine: Secondary | ICD-10-CM | POA: Diagnosis not present

## 2024-09-08 DIAGNOSIS — M9902 Segmental and somatic dysfunction of thoracic region: Secondary | ICD-10-CM | POA: Diagnosis not present

## 2024-09-08 DIAGNOSIS — M5412 Radiculopathy, cervical region: Secondary | ICD-10-CM | POA: Diagnosis not present

## 2024-09-08 DIAGNOSIS — M9901 Segmental and somatic dysfunction of cervical region: Secondary | ICD-10-CM | POA: Diagnosis not present

## 2024-09-08 DIAGNOSIS — M546 Pain in thoracic spine: Secondary | ICD-10-CM | POA: Diagnosis not present

## 2024-09-18 DIAGNOSIS — D171 Benign lipomatous neoplasm of skin and subcutaneous tissue of trunk: Secondary | ICD-10-CM | POA: Diagnosis not present

## 2024-09-18 DIAGNOSIS — D2262 Melanocytic nevi of left upper limb, including shoulder: Secondary | ICD-10-CM | POA: Diagnosis not present

## 2024-09-18 DIAGNOSIS — D2261 Melanocytic nevi of right upper limb, including shoulder: Secondary | ICD-10-CM | POA: Diagnosis not present

## 2024-09-18 DIAGNOSIS — D2271 Melanocytic nevi of right lower limb, including hip: Secondary | ICD-10-CM | POA: Diagnosis not present

## 2024-09-18 DIAGNOSIS — L821 Other seborrheic keratosis: Secondary | ICD-10-CM | POA: Diagnosis not present

## 2024-09-18 DIAGNOSIS — L853 Xerosis cutis: Secondary | ICD-10-CM | POA: Diagnosis not present

## 2024-09-18 DIAGNOSIS — L817 Pigmented purpuric dermatosis: Secondary | ICD-10-CM | POA: Diagnosis not present

## 2024-09-18 DIAGNOSIS — D2272 Melanocytic nevi of left lower limb, including hip: Secondary | ICD-10-CM | POA: Diagnosis not present

## 2024-09-18 DIAGNOSIS — D225 Melanocytic nevi of trunk: Secondary | ICD-10-CM | POA: Diagnosis not present

## 2024-09-22 DIAGNOSIS — E538 Deficiency of other specified B group vitamins: Secondary | ICD-10-CM | POA: Diagnosis not present

## 2024-09-22 DIAGNOSIS — E782 Mixed hyperlipidemia: Secondary | ICD-10-CM | POA: Diagnosis not present

## 2024-09-29 DIAGNOSIS — R739 Hyperglycemia, unspecified: Secondary | ICD-10-CM | POA: Diagnosis not present

## 2024-09-29 DIAGNOSIS — E782 Mixed hyperlipidemia: Secondary | ICD-10-CM | POA: Diagnosis not present

## 2024-09-29 DIAGNOSIS — D500 Iron deficiency anemia secondary to blood loss (chronic): Secondary | ICD-10-CM | POA: Diagnosis not present

## 2024-09-29 DIAGNOSIS — Q2733 Arteriovenous malformation of digestive system vessel: Secondary | ICD-10-CM | POA: Diagnosis not present

## 2024-09-29 DIAGNOSIS — Z0000 Encounter for general adult medical examination without abnormal findings: Secondary | ICD-10-CM | POA: Diagnosis not present

## 2024-09-29 DIAGNOSIS — E538 Deficiency of other specified B group vitamins: Secondary | ICD-10-CM | POA: Diagnosis not present

## 2024-09-29 DIAGNOSIS — Z23 Encounter for immunization: Secondary | ICD-10-CM | POA: Diagnosis not present

## 2024-09-29 DIAGNOSIS — F339 Major depressive disorder, recurrent, unspecified: Secondary | ICD-10-CM | POA: Diagnosis not present

## 2024-09-29 NOTE — Progress Notes (Signed)
 Medicare Wellness Visit   Providers Rendering Care Dr. Oneil Miller-Internal Medicine  Functional Assessment (1) Hearing: Demonstrates no difficulty in hearing during normal conversation (2) Risk of Falls: Patient denies any falls or near falls in the last year (3) Home Safety: Patient feels secure in their home. There are operational smoke alarms in multiple areas of the home. (4) Activities of Daily Living: Independently manages personal grooming and household chores, including cooking, cleaning and laundry.  Manages Personal finances without assistance.    Depression Screening PHQ 2/9 last 3 flowsheet values     09/18/2022    2:40 PM 09/24/2023    2:23 PM 09/29/2024    2:37 PM  PHQ-2/9 Depression Screening   Little interest or pleasure in doing things  0 0  Feeling down, depressed, or hopeless  0 0  Patient Health Questionnaire-2 Score  0 * 0  (OBSOLETE) Little interest or pleasure in doing things 0    (OBSOLETE) Feeling down, depressed, or hopeless (or irritable for Teens only)? 3    (OBSOLETE) Total Prescreening Score 3    (OBSOLETE) Trouble falling or staying asleep, or sleeping too much? 0    (OBSOLETE) Feeling tired or having little energy? 3    (OBSOLETE) Poor appetite or overeating? 0    (OBSOLETE) Feeling bad about yourself - or that you are a failure or have let yourself or your family down? 0    (OBSOLETE) Trouble concentrating on things, such as reading the newspaper or watching television? 0    (OBSOLETE) Moving or speaking so slowly that other people could have noticed?  Or the opposite - being so fidgety or restless that you have been moving around a lot more than usual? 0    (OBSOLETE) Thoughts that you would be better off dead, or of hurting yourself in some way? 0    (OBSOLETE) Total Score = 6      * Data saved with a previous flowsheet row definition     Depression Severity and Treatment Recommendations:  0-4= None  5-9= Mild / Treatment: Support, educate  to call if worse; return in one month  10-14= Moderate / Treatment: Support, watchful waiting; Antidepressant or Psychotherapy  15-19= Moderately severe / Treatment: Antidepressant OR Psychotherapy  >= 20 = Major depression, severe / Antidepressant AND Psychotherapy   Cognitive impairment Oriented to person, place and time.  Responses appear appropriate and timely to this observer.   Prevention Plan  Item name                              Frequency        Month Due       Year Due Health Maintenance  Topic Date Due  . Hepatitis C Screen  Never done  . Shingrix (2 of 2) 11/26/2017  . RSV Immunization Pregnant or 60+ (1 - 1-dose 75+ series) Never done  . Adult Tetanus (Td And Tdap)  06/21/2024  . Mammogram  11/20/2024  . Potassium Level  09/22/2025  . Serum Calcium  09/22/2025  . Depression Screening  09/29/2025  . Medicare Subsequent AWV [id number]  09/30/2025  . Annual Physical/Well Child Check  09/30/2025  . Colorectal Cancer Screening  09/19/2026  . DXA Bone Density Scan  01/19/2027  . Lipid Panel  09/22/2029  . Medicare Initial AWV [id number]  Completed  . Pneumococcal Vaccine: 50+  Completed  . Influenza Vaccine  Completed  . Hib Vaccines  Aged  Out  . Hepatitis A Vaccines  Aged Out  . Meningococcal B Vaccine  Aged Out  . Meningococcal ACWY Vaccine  Aged Out  . HPV Vaccines  Aged Out    Other personalized health advice Encouraged patient to exercise regularly.  Encouraged attention to diet with good intake of fruits, vegetables, and limitation of red meat to 2 times a week or less    End of Life Counseling Patient has a living will in place.  Patient is a full code.     *Some images could not be shown.

## 2024-09-29 NOTE — Progress Notes (Signed)
 ANNUAL EXAM  Loretta Ramsey is a 78 y.o. female  CHIEF COMPLAINT: Chief Complaint  Patient presents with  . Annual Exam    SUBJECTIVE:  Patient has not been active, notes that she is little bit weaker.  Weight overall stable.  Hemoglobin down to 11.8 from 12.7, does have some epigastric pain on occasion.  Last colonoscopy 2022 with tubular adenoma x 2.  No significant reflux symptoms. ______________________________________________________________________ A comprehensive ROS was negative in all 10 systems reviewed.  ALLERGIES: Ace inhibitors, Codeine, and Tussionex [hydrocodone-chlorpheniramine]  Past Medical History:  Diagnosis Date  . Arteriovenous malformation of liver ([facility]) 04/15/2019   Noted MRI 4/20, right posterior liver  . B12 deficiency 07/20/2014  . Cervical disc disease 04/30/2014  . Depression   . Fibrocystic breast disease 04/30/2014  . Menopause   . Migraine 04/30/2014  . Osteoarthritis     Past Surgical History:  Procedure Laterality Date  . APPENDECTOMY  1958  . HYSTERECTOMY  1991   right oophorectomy  . COLONOSCOPY  05/16/2011   02/25/2008, 12/19/2005, 06/07/2005, 01/22/2003; Adenomatous Polyps: [facility] 04/2016  . COLONOSCOPY  02/10/2016   PH Adenomatous Polyps: [facility] 01/2021  . COLONOSCOPY  09/19/2021   Tubular adenomas/Hyperplastic polyp/PHx CP/No repeat due to age/CTL  . Breast biopsy procedure     x3, benign  . CESAREAN SECTION      Current Outpatient Medications  Medication Sig Dispense Refill  . acetaminophen (TYLENOL) 500 MG tablet Take 500 mg by mouth 2 (two) times daily    . bumetanide (BUMEX) 0.5 MG tablet Take 1 tablet (0.5 mg total) by mouth once daily 90 tablet 3  . buPROPion (WELLBUTRIN XL) 300 MG XL tablet Take 1 tablet (300 mg total) by mouth once daily 90 tablet 3  . cyanocobalamin  2000 MCG tablet Take 2,000 mcg by mouth once daily    . mag hydrox/aluminum hyd/simeth (ANTACID ORAL) Take by mouth as needed    . meloxicam (MOBIC) 7.5 MG tablet  Take 1 tablet (7.5 mg total) by mouth once daily as needed for Pain 90 tablet 3  . polyethylene glycol (MIRALAX) powder Take 17 g by mouth every other day    . tolterodine (DETROL) 2 MG tablet Take 2 tablets (4 mg total) by mouth at bedtime 180 tablet 3  . traZODone (DESYREL) 50 MG tablet Take 1 tablet (50 mg total) by mouth at bedtime 90 tablet 3  . triamcinolone  0.5 % ointment Apply topically at bedtime 30 g 1  . valACYclovir (VALTREX) 500 MG tablet TAKE 1 TABLET BY MOUTH ONCE DAILY 30 tablet 11  . pantoprazole (PROTONIX) 40 MG DR tablet Take 1 tablet (40 mg total) by mouth once daily 30 tablet 2   No current facility-administered medications for this visit.    PHYSICAL EXAM: BP 120/80   Pulse 77   Ht 165.7 cm (5' 5.25)   Wt 68 kg (150 lb)   SpO2 99%   BMI 24.77 kg/m  Body mass index is 24.77 kg/m.  Wt Readings from Last 3 Encounters:  09/29/24 68 kg (150 lb)  05/05/24 66.8 kg (147 lb 3.2 oz)  01/09/24 66.4 kg (146 lb 6.2 oz)     BP Readings from Last 3 Encounters:  09/29/24 120/80  06/05/24 120/80  05/05/24 130/66    General: Alert oriented x3  Skin: No suspicious lesions or moles.   Eyes: Sclera and conjunctiva clear; pupils equal round and reactive to light and accommodation; extraocular movements intact Ears: External ears and canal normal; tympanic  membranes normal.   Nose: Mucosa healthy without drainage or ulceration Oropharynx: No suspicious lesions Neck: No swelling, masses, stiffness, pain, limited movement, carotid pulses normal bilaterally, thyroid  normal size, no masses palpated. No bruits heard. Lungs: Respirations unlabored; clear to auscultation bilaterally Back: No spinal deformity Cardiovascular: Heart regular rate and rhythm without murmurs, gallops, or rubs Abdomen: Soft; non tender; non distended;  no masses or organomegaly Lymph Nodes: No significant cervical, supraclavicular, or axillary lymphadenopathy noted Musculoskeletal: No active joint  inflammation Extremities: Normal, no edema Pulses: Dorsalis pedis palpable and symmetric bilaterally Neurologic: Alert and oriented times 3; speech intact; face symmetrical; moves all extremities well    ASSESSMENT/PLAN  Mild iron deficiency anemia-Vitron-C 3 times weekly, Protonix daily x 1-2 months, consistent with gastritis, likely from meloxicam, recheck 2 months B12 deficiency-good on replacement AVM liver-stable, no abdominal pain Osteoarthritis-doing well on meloxicam Urinary incontinence-on Detrol with improvement Mild depression-symptomatically stable on Wellbutrin Mammogram December   Goals Addressed               This Visit's Progress   .  Exercise 3x per week (30 min per time)   Not on track     Exercise,lose weight,eat more healthy meals     . * Patient Goals (pt-stated)        Exercise.        .phq2  Return in about 1 year (around 09/29/2025) for physical.              *Some images could not be shown.

## 2024-10-05 ENCOUNTER — Other Ambulatory Visit: Payer: Self-pay

## 2024-10-05 ENCOUNTER — Inpatient Hospital Stay
Admission: EM | Admit: 2024-10-05 | Discharge: 2024-10-13 | DRG: 522 | Disposition: A | Discharge status: Skilled Nursing Facility | Attending: Student in an Organized Health Care Education/Training Program | Admitting: Student in an Organized Health Care Education/Training Program

## 2024-10-05 ENCOUNTER — Emergency Department

## 2024-10-05 DIAGNOSIS — R10A1 Flank pain, right side: Secondary | ICD-10-CM | POA: Diagnosis present

## 2024-10-05 DIAGNOSIS — D72829 Elevated white blood cell count, unspecified: Secondary | ICD-10-CM | POA: Diagnosis present

## 2024-10-05 DIAGNOSIS — Z90710 Acquired absence of both cervix and uterus: Secondary | ICD-10-CM | POA: Diagnosis not present

## 2024-10-05 DIAGNOSIS — K219 Gastro-esophageal reflux disease without esophagitis: Secondary | ICD-10-CM | POA: Diagnosis not present

## 2024-10-05 DIAGNOSIS — I10 Essential (primary) hypertension: Secondary | ICD-10-CM | POA: Diagnosis not present

## 2024-10-05 DIAGNOSIS — D62 Acute posthemorrhagic anemia: Secondary | ICD-10-CM | POA: Diagnosis not present

## 2024-10-05 DIAGNOSIS — S72001A Fracture of unspecified part of neck of right femur, initial encounter for closed fracture: Principal | ICD-10-CM | POA: Diagnosis present

## 2024-10-05 DIAGNOSIS — Z471 Aftercare following joint replacement surgery: Secondary | ICD-10-CM | POA: Diagnosis not present

## 2024-10-05 DIAGNOSIS — R112 Nausea with vomiting, unspecified: Secondary | ICD-10-CM | POA: Diagnosis present

## 2024-10-05 DIAGNOSIS — R0902 Hypoxemia: Secondary | ICD-10-CM | POA: Diagnosis not present

## 2024-10-05 DIAGNOSIS — W19XXXA Unspecified fall, initial encounter: Secondary | ICD-10-CM | POA: Diagnosis not present

## 2024-10-05 DIAGNOSIS — S51019A Laceration without foreign body of unspecified elbow, initial encounter: Secondary | ICD-10-CM | POA: Diagnosis not present

## 2024-10-05 DIAGNOSIS — Z888 Allergy status to other drugs, medicaments and biological substances status: Secondary | ICD-10-CM

## 2024-10-05 DIAGNOSIS — S0990XA Unspecified injury of head, initial encounter: Secondary | ICD-10-CM | POA: Diagnosis present

## 2024-10-05 DIAGNOSIS — Z79899 Other long term (current) drug therapy: Secondary | ICD-10-CM | POA: Diagnosis not present

## 2024-10-05 DIAGNOSIS — F32A Depression, unspecified: Secondary | ICD-10-CM | POA: Diagnosis present

## 2024-10-05 DIAGNOSIS — M25551 Pain in right hip: Secondary | ICD-10-CM

## 2024-10-05 DIAGNOSIS — M6281 Muscle weakness (generalized): Secondary | ICD-10-CM | POA: Diagnosis not present

## 2024-10-05 DIAGNOSIS — Z23 Encounter for immunization: Secondary | ICD-10-CM

## 2024-10-05 DIAGNOSIS — R2689 Other abnormalities of gait and mobility: Secondary | ICD-10-CM | POA: Diagnosis not present

## 2024-10-05 DIAGNOSIS — S79929A Unspecified injury of unspecified thigh, initial encounter: Secondary | ICD-10-CM | POA: Diagnosis not present

## 2024-10-05 DIAGNOSIS — R531 Weakness: Secondary | ICD-10-CM | POA: Diagnosis not present

## 2024-10-05 DIAGNOSIS — Z9181 History of falling: Secondary | ICD-10-CM | POA: Diagnosis not present

## 2024-10-05 DIAGNOSIS — Z885 Allergy status to narcotic agent status: Secondary | ICD-10-CM

## 2024-10-05 DIAGNOSIS — Z803 Family history of malignant neoplasm of breast: Secondary | ICD-10-CM | POA: Diagnosis not present

## 2024-10-05 DIAGNOSIS — M25522 Pain in left elbow: Secondary | ICD-10-CM | POA: Diagnosis not present

## 2024-10-05 DIAGNOSIS — S50311A Abrasion of right elbow, initial encounter: Secondary | ICD-10-CM | POA: Diagnosis present

## 2024-10-05 DIAGNOSIS — R0789 Other chest pain: Secondary | ICD-10-CM | POA: Diagnosis present

## 2024-10-05 DIAGNOSIS — S299XXA Unspecified injury of thorax, initial encounter: Secondary | ICD-10-CM | POA: Diagnosis not present

## 2024-10-05 DIAGNOSIS — S3991XA Unspecified injury of abdomen, initial encounter: Secondary | ICD-10-CM | POA: Diagnosis not present

## 2024-10-05 DIAGNOSIS — R2681 Unsteadiness on feet: Secondary | ICD-10-CM | POA: Diagnosis not present

## 2024-10-05 DIAGNOSIS — Z87891 Personal history of nicotine dependence: Secondary | ICD-10-CM | POA: Diagnosis not present

## 2024-10-05 DIAGNOSIS — W010XXA Fall on same level from slipping, tripping and stumbling without subsequent striking against object, initial encounter: Secondary | ICD-10-CM | POA: Diagnosis present

## 2024-10-05 DIAGNOSIS — M4802 Spinal stenosis, cervical region: Secondary | ICD-10-CM | POA: Diagnosis not present

## 2024-10-05 DIAGNOSIS — M50321 Other cervical disc degeneration at C4-C5 level: Secondary | ICD-10-CM | POA: Diagnosis not present

## 2024-10-05 DIAGNOSIS — S72141D Displaced intertrochanteric fracture of right femur, subsequent encounter for closed fracture with routine healing: Secondary | ICD-10-CM | POA: Diagnosis not present

## 2024-10-05 DIAGNOSIS — Z7401 Bed confinement status: Secondary | ICD-10-CM | POA: Diagnosis not present

## 2024-10-05 DIAGNOSIS — S199XXA Unspecified injury of neck, initial encounter: Secondary | ICD-10-CM | POA: Diagnosis not present

## 2024-10-05 DIAGNOSIS — M4312 Spondylolisthesis, cervical region: Secondary | ICD-10-CM | POA: Diagnosis not present

## 2024-10-05 DIAGNOSIS — Z96641 Presence of right artificial hip joint: Secondary | ICD-10-CM | POA: Diagnosis not present

## 2024-10-05 DIAGNOSIS — M25572 Pain in left ankle and joints of left foot: Secondary | ICD-10-CM | POA: Diagnosis not present

## 2024-10-05 DIAGNOSIS — I6782 Cerebral ischemia: Secondary | ICD-10-CM | POA: Diagnosis not present

## 2024-10-05 DIAGNOSIS — S3993XA Unspecified injury of pelvis, initial encounter: Secondary | ICD-10-CM | POA: Diagnosis not present

## 2024-10-05 LAB — BASIC METABOLIC PANEL WITH GFR
Anion gap: 14 (ref 5–15)
BUN: 25 mg/dL — ABNORMAL HIGH (ref 8–23)
CO2: 24 mmol/L (ref 22–32)
Calcium: 9.5 mg/dL (ref 8.9–10.3)
Chloride: 98 mmol/L (ref 98–111)
Creatinine, Ser: 0.89 mg/dL (ref 0.44–1.00)
GFR, Estimated: >60 mL/min (ref >60)
Glucose, Bld: 92 mg/dL (ref 70–99)
Potassium: 4.2 mmol/L (ref 3.5–5.1)
Sodium: 136 mmol/L (ref 135–145)

## 2024-10-05 LAB — CBC WITH DIFFERENTIAL/PLATELET
Abs Immature Granulocytes: 0.08 K/uL — ABNORMAL HIGH (ref 0.00–0.07)
Basophils Absolute: 0.1 K/uL (ref 0.0–0.1)
Basophils Relative: 1 %
Eosinophils Absolute: 0.2 K/uL (ref 0.0–0.5)
Eosinophils Relative: 2 %
HCT: 38.6 % (ref 36.0–46.0)
Hemoglobin: 12.5 g/dL (ref 12.0–15.0)
Immature Granulocytes: 1 %
Lymphocytes Relative: 19 %
Lymphs Abs: 2.1 K/uL (ref 0.7–4.0)
MCH: 34.6 pg — ABNORMAL HIGH (ref 26.0–34.0)
MCHC: 32.4 g/dL (ref 30.0–36.0)
MCV: 106.9 fL — ABNORMAL HIGH (ref 80.0–100.0)
Monocytes Absolute: 0.8 K/uL (ref 0.1–1.0)
Monocytes Relative: 7 %
Neutro Abs: 7.7 K/uL (ref 1.7–7.7)
Neutrophils Relative %: 70 %
Platelets: 273 K/uL (ref 150–400)
RBC: 3.61 MIL/uL — ABNORMAL LOW (ref 3.87–5.11)
RDW: 12.6 % (ref 11.5–15.5)
WBC: 11 K/uL — ABNORMAL HIGH (ref 4.0–10.5)
nRBC: 0 % (ref 0.0–0.2)

## 2024-10-05 LAB — TROPONIN I (HIGH SENSITIVITY): Troponin I (High Sensitivity): 5 ng/L (ref <18)

## 2024-10-05 LAB — TYPE AND SCREEN
ABO/RH(D): O POS
Antibody Screen: NEGATIVE

## 2024-10-05 MED ORDER — IOHEXOL 300 MG/ML  SOLN
100.0000 mL | Freq: Once | INTRAMUSCULAR | Status: AC | PRN
Start: 2024-10-05 — End: 2024-10-05
  Administered 2024-10-05: 100 mL via INTRAVENOUS

## 2024-10-05 MED ORDER — BUPROPION HCL ER (XL) 150 MG PO TB24
300.0000 mg | ORAL_TABLET | Freq: Every day | ORAL | Status: DC
  Administered 2024-10-06 – 2024-10-13 (×8): 300 mg via ORAL
  Filled 2024-10-05 (×8): qty 2

## 2024-10-05 MED ORDER — PANTOPRAZOLE SODIUM 40 MG PO TBEC
40.0000 mg | DELAYED_RELEASE_TABLET | Freq: Every day | ORAL | Status: DC
Start: 2024-10-05 — End: 2024-10-10
  Administered 2024-10-05 – 2024-10-09 (×5): 40 mg via ORAL
  Filled 2024-10-05 (×5): qty 1

## 2024-10-05 MED ORDER — OXYCODONE HCL 5 MG PO TABS
5.0000 mg | ORAL_TABLET | ORAL | Status: DC | PRN
  Administered 2024-10-06 – 2024-10-10 (×11): 5 mg via ORAL
  Filled 2024-10-05 (×15): qty 1

## 2024-10-05 MED ORDER — HYDRALAZINE HCL 20 MG/ML IJ SOLN
10.0000 mg | Freq: Four times a day (QID) | INTRAMUSCULAR | Status: DC | PRN
  Administered 2024-10-07: 10 mg via INTRAVENOUS
  Filled 2024-10-05: qty 1

## 2024-10-05 MED ORDER — CEFAZOLIN SODIUM-DEXTROSE 2-4 GM/100ML-% IV SOLN
2.0000 g | INTRAVENOUS | Status: AC
  Administered 2024-10-07: 2 g via INTRAVENOUS
  Filled 2024-10-05: qty 100

## 2024-10-05 MED ORDER — ONDANSETRON HCL 4 MG/2ML IJ SOLN
4.0000 mg | Freq: Four times a day (QID) | INTRAMUSCULAR | Status: DC | PRN
  Administered 2024-10-05 – 2024-10-07 (×4): 4 mg via INTRAVENOUS
  Filled 2024-10-05 (×5): qty 2

## 2024-10-05 MED ORDER — SODIUM CHLORIDE 0.9% FLUSH
3.0000 mL | Freq: Two times a day (BID) | INTRAVENOUS | Status: DC
  Administered 2024-10-05 – 2024-10-10 (×10): 3 mL via INTRAVENOUS

## 2024-10-05 MED ORDER — HYDROMORPHONE HCL 1 MG/ML IJ SOLN
0.5000 mg | Freq: Once | INTRAMUSCULAR | Status: AC
  Administered 2024-10-05: 0.5 mg via INTRAVENOUS
  Filled 2024-10-05: qty 0.5

## 2024-10-05 MED ORDER — TETANUS-DIPHTH-ACELL PERTUSSIS 5-2-15.5 LF-MCG/0.5 IM SUSP
0.5000 mL | Freq: Once | INTRAMUSCULAR | Status: AC
  Administered 2024-10-05: 0.5 mL via INTRAMUSCULAR
  Filled 2024-10-05: qty 0.5

## 2024-10-05 MED ORDER — ONDANSETRON HCL 4 MG PO TABS: 4.0000 mg | ORAL_TABLET | Freq: Four times a day (QID) | ORAL | Status: DC | PRN

## 2024-10-05 MED ORDER — TRAZODONE HCL 50 MG PO TABS
50.0000 mg | ORAL_TABLET | Freq: Every day | ORAL | Status: DC
  Administered 2024-10-05 – 2024-10-12 (×8): 50 mg via ORAL
  Filled 2024-10-05 (×8): qty 1

## 2024-10-05 MED ORDER — OXYCODONE HCL 5 MG PO TABS
2.5000 mg | ORAL_TABLET | ORAL | Status: DC | PRN
  Administered 2024-10-05: 2.5 mg via ORAL
  Filled 2024-10-05: qty 1

## 2024-10-05 MED ORDER — ACETAMINOPHEN 325 MG PO TABS
650.0000 mg | ORAL_TABLET | Freq: Four times a day (QID) | ORAL | Status: DC | PRN
  Administered 2024-10-09: 650 mg via ORAL
  Filled 2024-10-05: qty 2

## 2024-10-05 MED ORDER — HYDROMORPHONE HCL 1 MG/ML IJ SOLN
0.5000 mg | INTRAMUSCULAR | Status: DC | PRN
  Administered 2024-10-07: 0.5 mg via INTRAVENOUS
  Filled 2024-10-05: qty 0.5

## 2024-10-05 NOTE — H&P (Addendum)
 History and Physical    Patient: Loretta Ramsey FMW:[id number] DOB: [birth year] DOA: 10/05/2024 DOS: the patient was seen and examined on 10/05/2024 PCP: Cleotilde Oneil FALCON, MD  Patient coming from: Home  Chief Complaint:  Chief Complaint  Patient presents with   Hip Pain   HPI: Loretta Ramsey is a 78 y.o. female with medical history significant of GERD, Depression, urinary incontinence presented with right-sided pain after she slipped and fell on the concrete porch. States did bump her head but denies head injury, LOC.  Reports right hip pain, also has right flank, right thoracic cage pain.  Has a skin tear near the right elbow with some tenderness. Not on anticoagulation, Denies dizziness, LOC, fever, chills, nausea/vomiting, SOB, abdominal pain Independent with ADLs, lives with her husband  On presentation to ED afebrile, RR 20, HR 80, BP elevated 177/86, saturating 99% on room air. Workup revealed BMP unremarkable except BUN 25, troponin x 1 not elevated, CBC with mild leukocytosis 11.0, Hb 12.5 with MCV 106.9, platelet count 273. CT C-spine with no acute fracture.  Multilevel degenerative disc disease.  CT chest/abdomen/pelvis shows acute transverse fracture of right femoral neck without intertrochanteric involvement.  Otherwise no acute changes. CT head negative for acute changes XR right elbow negative, XR right pelvis shows acute right femoral neck fracture with impaction and varus angulation of the distal fragment  In ED received Tdap, IV Dilaudid. ER physician discussed with Orthopedics, plan for OR tomorrow  Review of Systems: As mentioned in the history of present illness. All other systems reviewed and are negative. Past Medical History:  Diagnosis Date   Arteriovenous malformation of liver    B12 deficiency    Cervical disc disease    Depression    Hypertension    Osteoarthritis    Past Surgical History:  Procedure Laterality Date   ABDOMINAL HYSTERECTOMY      APPENDECTOMY     BREAST CYST ASPIRATION Left 12/31/2013   COLONOSCOPY     COLONOSCOPY WITH PROPOFOL  N/A 02/10/2016   Procedure: COLONOSCOPY WITH PROPOFOL ;  Surgeon: Lamar ONEIDA Holmes, MD;  Location: [facility] ENDOSCOPY;  Service: Endoscopy;  Laterality: N/A;   COLONOSCOPY WITH PROPOFOL  N/A 09/19/2021   Procedure: COLONOSCOPY WITH PROPOFOL ;  Surgeon: Maryruth Ole ONEIDA, MD;  Location: [facility] ENDOSCOPY;  Service: Endoscopy;  Laterality: N/A;   Social History:  reports that she has quit smoking. She does not have any smokeless tobacco history on file. She reports current alcohol use. She reports that she does not use drugs.  Allergies  Allergen Reactions   Ace Inhibitors     cough   Codeine Nausea Only    Family History  Problem Relation Age of Onset   Breast cancer Maternal Aunt     Prior to Admission medications   Medication Sig Start Date End Date Taking? Authorizing Provider  buPROPion (WELLBUTRIN XL) 300 MG 24 hr tablet Take 300 mg by mouth daily.    [provider]  estrogens, conjugated, (PREMARIN) 0.45 MG tablet Take 0.45 mg by mouth daily. Take daily for 21 days then do not take for 7 days.    [provider]  lisinopril-hydrochlorothiazide (PRINZIDE,ZESTORETIC) 10-12.5 MG tablet Take 1 tablet by mouth daily.    [provider]  pantoprazole (PROTONIX) 40 MG tablet Take 40 mg by mouth daily.    [provider]  tolterodine (DETROL) 2 MG tablet Take 2 mg by mouth 2 (two) times daily.    [provider]  traZODone (DESYREL) 50  MG tablet Take 50 mg by mouth at bedtime.    [provider]  valACYclovir (VALTREX) 500 MG tablet Take 500 mg by mouth 2 (two) times daily. Patient not taking: Reported on 09/19/2021    [provider]  vitamin B-12 (CYANOCOBALAMIN ) 1000 MCG tablet Take 1,000 mcg by mouth daily.    [provider]    Physical Exam: Vitals:   10/05/24 1430 10/05/24 1500 10/05/24 1530 10/05/24 1807  BP: (!)  168/143 (!) 162/61 (!) 180/77   Pulse: 84 81    Resp: (!) 30 16 19    Temp:      TempSrc:      SpO2: 99% 98%  100%  Weight:      Height:        General: Awake, no distress.  CV:  RRR, no murmurs heard, no pedal edema Resp: clear to auscultation bilaterally Abd: No distention.  Soft, nontender MSK: Right hip and flank TTP, ROM dec due to pain, skin abrasion R elbow Neuro: AO x 3, no gross focal deficits. RLE limited due to pain   Data Reviewed: Labs and Imaging reviewed  Assessment and Plan:  Right femoral neck fracture - s/p Mechanical Fall - Trauma work up negative except Acute Right femoral neck fracture with impaction and varus angulation of the distal fragment - Orthopedics consult, notified by ER physician - NPO PM, not on AC - Pain management, Antiemetics prn - RCRI score 0, Functional METS > 4, Patient is at low risk for intermediate risk procedure  Elevated BP - Not on meds at home, likely elevated due to pain - IV Hydralazine with holding parameters - Monitor BP  Mild leukocytosis - likely reactive, no infectious symptoms - Monitor WBC  GERD - PPI  Depression - Wellbutrin, Trazodone  Incidental findings - 1.2 cm diameter ground glass nodule in RUL.  Follow-up with CT in 6 months   Advance Care Planning:   Code Status: Full Code - discussed with patient at bedside  Consults: Orthopedics  Family Communication: Husband and son at the bedside  Severity of Illness: The appropriate patient status for this patient is INPATIENT. Inpatient status is judged to be reasonable and necessary in order to provide the required intensity of service to ensure the patient's safety. The patient's presenting symptoms, physical exam findings, and initial radiographic and laboratory data in the context of their chronic comorbidities is felt to place them at high risk for further clinical deterioration. Furthermore, it is not anticipated that the patient will be medically  stable for discharge from the hospital within 2 midnights of admission.   * I certify that at the point of admission it is my clinical judgment that the patient will require inpatient hospital care spanning beyond 2 midnights from the point of admission due to high intensity of service, high risk for further deterioration and high frequency of surveillance required.*  Author: Laree Lock, MD 10/05/2024 6:15 PM  For on call review [email or web address].

## 2024-10-05 NOTE — ED Provider Notes (Signed)
 [facility] Provider Note    Event Date/Time   First MD Initiated Contact with Patient 10/05/24 1351     (approximate)   History   Hip Pain   HPI  Loretta Ramsey is a 78 y.o. female with history of hypertension, presenting with right sided pain after she slipped and fell on the porch.  States that she fell on concrete.  Did hit her head but no LOC.  Has pain to mainly her right hip.  Does note right flank, right thoracic cage tenderness as well.  Has a skin tear to her elbow with some tenderness.  Denies being on any blood thinning medications, states she does not take any medications.  Denies any preceding symptoms, no infectious symptoms.  Independent history from EMS, she was complaining about 10 of 10 pain to her right hip.  Was given intranasal fentanyl .  On independent review, she was seen by primary care the end of September, was there for routine medical exam, has history of B12 deficiency, and mild iron deficiency anemia.     Physical Exam   Triage Vital Signs: ED Triage Vitals  Encounter Vitals Group     BP --      Girls Systolic BP Percentile --      Girls Diastolic BP Percentile --      Boys Systolic BP Percentile --      Boys Diastolic BP Percentile --      Pulse Rate 10/05/24 1355 70     Resp 10/05/24 1355 17     Temp 10/05/24 1355 98.3 F (36.8 C)     Temp Source 10/05/24 1355 Oral     SpO2 10/05/24 1355 99 %     Weight 10/05/24 1357 153 lb (69.4 kg)     Height 10/05/24 1357 5' 6 (1.676 m)     Head Circumference --      Peak Flow --      Pain Score 10/05/24 1356 10     Pain Loc --      Pain Education --      Exclude from Growth Chart --     Most recent vital signs: Vitals:   10/05/24 1355 10/05/24 1400  BP:  (!) 177/86  Pulse: 70 80  Resp: 17 20  Temp: 98.3 F (36.8 C)   SpO2: 99% 99%     General: Awake, no distress.  CV:  Good peripheral perfusion.  Resp:  Normal effort.  Right lateral thoracic cage  tenderness Abd:  No distention.  Soft, nontender Other:  Tender to palpation to her right flank, right hip.  Reduced range of motion to her right lower extremity due to pain.  Tenderness to the rest of her thigh, knee, tib-fib, ankle, foot, no tenderness to left upper and lower extremity.  She does have a skin abrasion to her right elbow with associated tenderness, able to fully range her bilateral upper extremities with equal DP and radial pulses.  Dorsi and plantarflexion are equal bilaterally, grip strength is equal, sensation is intact.  No midline spinal tenderness   ED Results / Procedures / Treatments   Labs (all labs ordered are listed, but only abnormal results are displayed) Labs Reviewed  CBC WITH DIFFERENTIAL/PLATELET - Abnormal; Notable for the following components:      Result Value   WBC 11.0 (*)    RBC 3.61 (*)    MCV 106.9 (*)    MCH 34.6 (*)    Abs Immature  Granulocytes 0.08 (*)    All other components within normal limits  BASIC METABOLIC PANEL WITH GFR - Abnormal; Notable for the following components:   BUN 25 (*)    All other components within normal limits  TYPE AND SCREEN  TROPONIN I (HIGH SENSITIVITY)     EKG  EKG shows, sinus rhythm, rate 79, normal QS, normal QTc, T wave flattening in 1, T wave inversion to aVL, no prior to compare   RADIOLOGY On my independent interpretation, x-ray shows right hip fracture   PROCEDURES:  Critical Care performed: No  Procedures   MEDICATIONS ORDERED IN ED: Medications  Tdap (ADACEL) injection 0.5 mL (0.5 mLs Intramuscular Given 10/05/24 1427)  HYDROmorphone (DILAUDID) injection 0.5 mg (0.5 mg Intravenous Given 10/05/24 1422)     IMPRESSION / MDM / ASSESSMENT AND PLAN / ED COURSE  I reviewed the triage vital signs and the nursing notes.                              Differential diagnosis includes, but is not limited to, fracture, contusion, strain, sprain, intracranial hemorrhage.  Will get labs, EKG,  troponin, CT head, cervical spine, chest abdomen pelvis, x-ray elbow.  X-ray hip.  Will give her some IV Dilaudid here.  Patient's presentation is most consistent with acute presentation with potential threat to life or bodily function.  Independent interpretation of labs and imaging below.  Patient signed out pending rest of imaging results, will likely need to be admitted for hip fracture.  The patient is on the cardiac monitor to evaluate for evidence of arrhythmia and/or significant heart rate changes.   Clinical Course as of 10/05/24 1516  Mon Oct 05, 2024  1508 Independent review of labs, mild leukocytosis, electrolytes not severely deranged, creatinine is normal. [TT]  1516 DG Elbow Complete Right Negative [TT]  1516 DG Hip Unilat  With Pelvis 2-3 Views Right IMPRESSION: 1. Acute right femoral neck fracture with impaction and varus angulation of the distal fragment.   [TT]    Clinical Course User Index [TT] Waymond Lorelle Cummins, MD     FINAL CLINICAL IMPRESSION(S) / ED DIAGNOSES   Final diagnoses:  Fall, initial encounter  Right hip pain  Right-sided chest wall pain  Right flank pain     Rx / DC Orders   ED Discharge Orders     None        Note:  This document was prepared using Dragon voice recognition software and may include unintentional dictation errors.    Waymond Lorelle Cummins, MD 10/05/24 [id number]

## 2024-10-05 NOTE — Consult Note (Signed)
 ORTHOPEDIC CONSULTATION  Loretta Ramsey [id number]  10/05/2024  CC:  Chief Complaint  Patient presents with   Hip Pain    History of Present IlIness: The patient is a 78 y.o. female who suffered a ground-level fall landing on her right hip while she was hosing off her concrete back porch and slipped and fell.  She had no dizziness or chest pain prior to falling.  She was evaluated at the local emergency department and orthopedics was consulted for a right femoral neck fracture.  Further questioning of the patient reveals that she helps take care of her husband but she is concerned that he will medically not be able to help take care of her after surgery.  She does have a son and a granddaughter in the area for aftercare.  Admission history for completeness: Loretta Ramsey is a 78 y.o. female with medical history significant of GERD, Depression, urinary incontinence presented with right-sided pain after she slipped and fell on the concrete porch. States did bump her head but denies head injury, LOC.  Reports right hip pain, also has right flank, right thoracic cage pain.  Has a skin tear near the right elbow with some tenderness. Not on anticoagulation, Denies dizziness, LOC, fever, chills, nausea/vomiting, SOB, abdominal pain Independent with ADLs, lives with her husband  PMH:  Past Medical History:  Diagnosis Date   Arteriovenous malformation of liver    B12 deficiency    Cervical disc disease    Depression    Hypertension    Osteoarthritis     SH:  Past Surgical History:  Procedure Laterality Date   ABDOMINAL HYSTERECTOMY     APPENDECTOMY     BREAST CYST ASPIRATION Left 12/31/2013   COLONOSCOPY     COLONOSCOPY WITH PROPOFOL  N/A 02/10/2016   Procedure: COLONOSCOPY WITH PROPOFOL ;  Surgeon: Lamar ONEIDA Holmes, MD;  Location: [facility] ENDOSCOPY;  Service: Endoscopy;  Laterality: N/A;   COLONOSCOPY WITH PROPOFOL  N/A 09/19/2021   Procedure: COLONOSCOPY WITH PROPOFOL ;  Surgeon: Maryruth Ole ONEIDA, MD;  Location: [facility] ENDOSCOPY;  Service: Endoscopy;  Laterality: N/A;    ALL:  Allergies  Allergen Reactions   Ace Inhibitors Cough    cough   Codeine Nausea Only   Hydrocodone Nausea Only    MED: (Not in a hospital admission)   All home medications have been reviewed as documented in the medication reconciliation portion of the patient record.  FH:  Family History  Problem Relation Age of Onset   Breast cancer Maternal Aunt     Social:  reports that she has quit smoking. She does not have any smokeless tobacco history on file. She reports current alcohol use. She reports that she does not use drugs.  Review of Systems: General: Denies fever, chills, weight loss Eyes: Denies blurry vision, changes in vision ENT: Denies sore throat, congestions, nosebleeds CV: Denies chest pain, palpitations Respiratory: Denies shortness of breath, wheezing, cough Gl: Denies abdominal pain, nausea, vomiting GU: Denies hematuria Integumentary: Denies rashes or lesions Neuro: Denies headache, dizziness Psych: Negative Hem/Onc: Denies easy bruising or bleeding disorders Musculoskeletal: See HPI above.  Vitals: BP (!) 180/77   Pulse 81   Temp 98.3 F (36.8 C) (Oral)   Resp 19   Ht 5' 6 (1.676 m)   Wt 69.4 kg   SpO2 100%   BMI 24.69 kg/m    Physical Exam: General: Awake, alert and oriented, no acute distress. Eyes: Pupils reactive, EOMI, normal conjunctiva, no scleral icterus. HENT: Normocephalic, atraumatic,  normal hearing, moist oral mucosa Neck: Supple, non-tender, no cervical lymphadenopathy. Lungs: Chest rise is symmetric, non-labored respiration, chest wall nontender to palpation Heart: Normal rate by palpation, normal peripheral perfusion Abdomen: Soft, non-tender, non-distended. Pelvis is stable. Skin: Skin envelope intact, dry and pink, no rashes or lesions, no signs of infection. Neurologic: Awake, alert, and oriented X3 Psychiatric: Cooperative,  appropriate mood and affect.  Musculoskeletal: Evaluation of the patient's bilateral upper extremities and left lower extremity reveal no areas of tenderness or limitations to range of motion. Any motion about the patient's right hip is extremely painful. Palpation of the right distal femur, knee, tib-fib region, ankle and foot show no areas of tenderness. The patient's calves are soft and nontender with no palpable cords. Homans testing is negative. Dorsalis pedis and posterior tibial pulse are 2+ and symmetric. The patient's toes are pink and warm with a brisk capillary refill time. The patient is able to gently move their ankles and toes on command. Sensation is intact over all lower extremity dermatomal patterns.   Radiographic findings: Multiple views of the patient's pelvis and right proximal femur were independently evaluated on the PACS system.  The patient has an impacted, angulated femoral neck fracture on the right side.  The proximal left femur appears intact.  No acute bony abnormalities are noted in the visualized pelvis.   Labs:  Recent Labs    10/05/24 1405  HGB 12.5   Recent Labs    10/05/24 1405  WBC 11.0*  RBC 3.61*  HCT 38.6  PLT 273   Recent Labs    10/05/24 1405  NA 136  K 4.2  CL 98  CO2 24  BUN 25*  CREATININE 0.89  GLUCOSE 92  CALCIUM 9.5   No results for input(s): LABPT, INR in the last 72 hours.   Assessment/Plan:    Assessment: 78 year old female with closed, impacted, angulated right femoral neck fracture.   Plan: I discussed with the patient based on her activity level and fracture pattern I would recommend a bipolar hemiarthroplasty.  We discussed that I will be leaving in a few hours to return to [place] and one of my colleagues in the [facility] will assume her care and most likely agree that a bipolar hemiarthroplasty would be appropriate.  We discussed that I believe it is too displaced to undergo repair and not be concerned about  the viability of the head.  The patient is not on any major blood thinners and will be placed n.p.o. after midnight.  The patient is agreeable with this treatment plan.  Cordella Lamar Knack M.D. 10/05/2024 7:29 PM

## 2024-10-05 NOTE — ED Triage Notes (Signed)
 Pt arrived via [facility] from home with c/o right hip pain after a slip on back patio. Pt denies LOC, pt did bump head but denies injury to head. Pt has 10/10 pain in right leg. Pt is A&Ox4.

## 2024-10-06 ENCOUNTER — Other Ambulatory Visit: Payer: Self-pay

## 2024-10-06 ENCOUNTER — Inpatient Hospital Stay

## 2024-10-06 ENCOUNTER — Encounter
Admission: EM | Disposition: A | Discharge status: Skilled Nursing Facility | Payer: Self-pay | Source: Home / Self Care | Attending: Student in an Organized Health Care Education/Training Program

## 2024-10-06 ENCOUNTER — Inpatient Hospital Stay: Admitting: Anesthesiology

## 2024-10-06 DIAGNOSIS — S72001A Fracture of unspecified part of neck of right femur, initial encounter for closed fracture: Secondary | ICD-10-CM | POA: Diagnosis not present

## 2024-10-06 HISTORY — PX: HIP ARTHROPLASTY: SHX981

## 2024-10-06 LAB — CBC
HCT: 34.5 % — ABNORMAL LOW (ref 36.0–46.0)
Hemoglobin: 11.3 g/dL — ABNORMAL LOW (ref 12.0–15.0)
MCH: 34.5 pg — ABNORMAL HIGH (ref 26.0–34.0)
MCHC: 32.8 g/dL (ref 30.0–36.0)
MCV: 105.2 fL — ABNORMAL HIGH (ref 80.0–100.0)
Platelets: 212 K/uL (ref 150–400)
RBC: 3.28 MIL/uL — ABNORMAL LOW (ref 3.87–5.11)
RDW: 12.7 % (ref 11.5–15.5)
WBC: 8.3 K/uL (ref 4.0–10.5)
nRBC: 0 % (ref 0.0–0.2)

## 2024-10-06 LAB — BASIC METABOLIC PANEL WITH GFR
Anion gap: 9 (ref 5–15)
BUN: 21 mg/dL (ref 8–23)
CO2: 27 mmol/L (ref 22–32)
Calcium: 8.7 mg/dL — ABNORMAL LOW (ref 8.9–10.3)
Chloride: 101 mmol/L (ref 98–111)
Creatinine, Ser: 0.68 mg/dL (ref 0.44–1.00)
GFR, Estimated: >60 mL/min (ref >60)
Glucose, Bld: 111 mg/dL — ABNORMAL HIGH (ref 70–99)
Potassium: 3.9 mmol/L (ref 3.5–5.1)
Sodium: 137 mmol/L (ref 135–145)

## 2024-10-06 LAB — ABO/RH: ABO/RH(D): O POS

## 2024-10-06 LAB — TROPONIN I (HIGH SENSITIVITY): Troponin I (High Sensitivity): 10 ng/L (ref <18)

## 2024-10-06 SURGERY — HEMIARTHROPLASTY (BIPOLAR) HIP, POSTERIOR APPROACH FOR FRACTURE
Anesthesia: General | Site: Hip | Laterality: Right

## 2024-10-06 MED ORDER — OXYCODONE HCL 5 MG PO TABS: 5.0000 mg | ORAL_TABLET | Freq: Once | ORAL | Status: DC | PRN

## 2024-10-06 MED ORDER — ZOLPIDEM TARTRATE 5 MG PO TABS
5.0000 mg | ORAL_TABLET | Freq: Every evening | ORAL | Status: DC | PRN
Start: 2024-10-06 — End: 2024-10-13

## 2024-10-06 MED ORDER — CEFAZOLIN SODIUM-DEXTROSE 2-4 GM/100ML-% IV SOLN
INTRAVENOUS | Status: AC
  Filled 2024-10-06: qty 100

## 2024-10-06 MED ORDER — FENTANYL CITRATE PF 50 MCG/ML IJ SOSY
PREFILLED_SYRINGE | INTRAMUSCULAR | Status: AC
  Filled 2024-10-06: qty 1

## 2024-10-06 MED ORDER — TRANEXAMIC ACID-NACL 1000-0.7 MG/100ML-% IV SOLN
1000.0000 mg | Freq: Once | INTRAVENOUS | Status: AC
  Administered 2024-10-06: 1000 mg via INTRAVENOUS

## 2024-10-06 MED ORDER — DEXAMETHASONE SODIUM PHOSPHATE 10 MG/ML IJ SOLN
INTRAMUSCULAR | Status: AC
  Filled 2024-10-06: qty 1

## 2024-10-06 MED ORDER — PHENOL 1.4 % MT LIQD: 1.0000 | OROMUCOSAL | Status: DC | PRN

## 2024-10-06 MED ORDER — SENNA 8.6 MG PO TABS
1.0000 | ORAL_TABLET | Freq: Two times a day (BID) | ORAL | Status: DC
  Administered 2024-10-06 – 2024-10-09 (×7): 8.6 mg via ORAL
  Filled 2024-10-06 (×8): qty 1

## 2024-10-06 MED ORDER — LIDOCAINE HCL (CARDIAC) PF 100 MG/5ML IV SOSY
PREFILLED_SYRINGE | INTRAVENOUS | Status: DC | PRN
  Administered 2024-10-06: 100 mg via INTRAVENOUS

## 2024-10-06 MED ORDER — SENNOSIDES-DOCUSATE SODIUM 8.6-50 MG PO TABS
1.0000 | ORAL_TABLET | Freq: Every evening | ORAL | Status: DC | PRN
Start: 2024-10-06 — End: 2024-10-07

## 2024-10-06 MED ORDER — PROPOFOL 10 MG/ML IV BOLUS
INTRAVENOUS | Status: AC
  Filled 2024-10-06: qty 20

## 2024-10-06 MED ORDER — ROCURONIUM BROMIDE 10 MG/ML (PF) SYRINGE
PREFILLED_SYRINGE | INTRAVENOUS | Status: AC
  Filled 2024-10-06: qty 10

## 2024-10-06 MED ORDER — KETAMINE HCL 50 MG/5ML IJ SOSY
PREFILLED_SYRINGE | INTRAMUSCULAR | Status: DC | PRN
  Administered 2024-10-06: 20 mg via INTRAVENOUS
  Administered 2024-10-06: 10 mg via INTRAVENOUS

## 2024-10-06 MED ORDER — MIDAZOLAM HCL 2 MG/2ML IJ SOLN
INTRAMUSCULAR | Status: AC
  Filled 2024-10-06: qty 2

## 2024-10-06 MED ORDER — LIDOCAINE HCL (PF) 2 % IJ SOLN
INTRAMUSCULAR | Status: AC
Start: 2024-10-06 — End: 2024-10-06
  Filled 2024-10-06: qty 5

## 2024-10-06 MED ORDER — FENTANYL CITRATE (PF) 100 MCG/2ML IJ SOLN
INTRAMUSCULAR | Status: AC
  Filled 2024-10-06: qty 2

## 2024-10-06 MED ORDER — OXYCODONE HCL 5 MG/5ML PO SOLN: 5.0000 mg | Freq: Once | ORAL | Status: DC | PRN

## 2024-10-06 MED ORDER — GLYCOPYRROLATE 0.2 MG/ML IJ SOLN
INTRAMUSCULAR | Status: AC
  Filled 2024-10-06: qty 1

## 2024-10-06 MED ORDER — 0.9 % SODIUM CHLORIDE (POUR BTL) OPTIME
TOPICAL | Status: DC | PRN
Start: 2024-10-06 — End: 2024-10-06
  Administered 2024-10-06: 500 mL

## 2024-10-06 MED ORDER — FLEET ENEMA RE ENEM: 1.0000 | ENEMA | Freq: Once | RECTAL | Status: DC | PRN

## 2024-10-06 MED ORDER — TRANEXAMIC ACID-NACL 1000-0.7 MG/100ML-% IV SOLN
INTRAVENOUS | Status: AC
  Filled 2024-10-06: qty 100

## 2024-10-06 MED ORDER — SODIUM CHLORIDE 0.9 % IR SOLN
Status: DC | PRN
  Administered 2024-10-06: 3000 mL

## 2024-10-06 MED ORDER — BISACODYL 10 MG RE SUPP
10.0000 mg | Freq: Every day | RECTAL | Status: DC | PRN
  Administered 2024-10-09: 10 mg via RECTAL
  Filled 2024-10-06: qty 1

## 2024-10-06 MED ORDER — BUPIVACAINE HCL (PF) 0.5 % IJ SOLN
INTRAMUSCULAR | Status: AC
  Filled 2024-10-06: qty 30

## 2024-10-06 MED ORDER — ONDANSETRON HCL 4 MG/2ML IJ SOLN
INTRAMUSCULAR | Status: AC
  Filled 2024-10-06: qty 2

## 2024-10-06 MED ORDER — ENOXAPARIN SODIUM 40 MG/0.4ML IJ SOSY
40.0000 mg | PREFILLED_SYRINGE | INTRAMUSCULAR | Status: DC
Start: 2024-10-07 — End: 2024-10-13
  Administered 2024-10-07 – 2024-10-13 (×7): 40 mg via SUBCUTANEOUS
  Filled 2024-10-06 (×7): qty 0.4

## 2024-10-06 MED ORDER — PHENYLEPHRINE HCL-NACL 20-0.9 MG/250ML-% IV SOLN
INTRAVENOUS | Status: AC
  Filled 2024-10-06: qty 250

## 2024-10-06 MED ORDER — DEXMEDETOMIDINE HCL IN NACL 80 MCG/20ML IV SOLN
INTRAVENOUS | Status: AC
  Filled 2024-10-06: qty 20

## 2024-10-06 MED ORDER — ONDANSETRON HCL 4 MG/2ML IJ SOLN
INTRAMUSCULAR | Status: DC | PRN
  Administered 2024-10-06: 4 mg via INTRAVENOUS

## 2024-10-06 MED ORDER — GLYCOPYRROLATE 0.2 MG/ML IJ SOLN
INTRAMUSCULAR | Status: DC | PRN
  Administered 2024-10-06: .1 mg via INTRAVENOUS

## 2024-10-06 MED ORDER — BUPIVACAINE LIPOSOME 1.3 % IJ SUSP
INTRAMUSCULAR | Status: DC | PRN
  Administered 2024-10-06: 50 mL via INTRAMUSCULAR

## 2024-10-06 MED ORDER — MIDAZOLAM HCL 2 MG/2ML IJ SOLN
INTRAMUSCULAR | Status: DC | PRN
Start: 2024-10-06 — End: 2024-10-06
  Administered 2024-10-06: 1 mg via INTRAVENOUS

## 2024-10-06 MED ORDER — PROPOFOL 10 MG/ML IV BOLUS
INTRAVENOUS | Status: DC | PRN
  Administered 2024-10-06: 90 mg via INTRAVENOUS

## 2024-10-06 MED ORDER — LACTATED RINGERS IV SOLN: INTRAVENOUS | Status: DC

## 2024-10-06 MED ORDER — ACETAMINOPHEN 10 MG/ML IV SOLN
INTRAVENOUS | Status: AC
  Filled 2024-10-06: qty 100

## 2024-10-06 MED ORDER — FENTANYL CITRATE PF 50 MCG/ML IJ SOSY
50.0000 ug | PREFILLED_SYRINGE | Freq: Once | INTRAMUSCULAR | Status: AC
  Administered 2024-10-06: 50 ug via INTRAVENOUS

## 2024-10-06 MED ORDER — DROPERIDOL 2.5 MG/ML IJ SOLN
0.6250 mg | Freq: Once | INTRAMUSCULAR | Status: AC
  Administered 2024-10-06: 0.625 mg via INTRAVENOUS

## 2024-10-06 MED ORDER — FENTANYL CITRATE (PF) 100 MCG/2ML IJ SOLN
25.0000 ug | INTRAMUSCULAR | Status: DC | PRN
  Administered 2024-10-06: 25 ug via INTRAVENOUS

## 2024-10-06 MED ORDER — TRANEXAMIC ACID-NACL 1000-0.7 MG/100ML-% IV SOLN
INTRAVENOUS | Status: DC | PRN
Start: 2024-10-06 — End: 2024-10-06
  Administered 2024-10-06: 1000 mg via INTRAVENOUS

## 2024-10-06 MED ORDER — BUPIVACAINE LIPOSOME 1.3 % IJ SUSP
INTRAMUSCULAR | Status: AC
  Filled 2024-10-06: qty 20

## 2024-10-06 MED ORDER — PROPOFOL 1000 MG/100ML IV EMUL
INTRAVENOUS | Status: AC
  Filled 2024-10-06: qty 100

## 2024-10-06 MED ORDER — MENTHOL 3 MG MT LOZG: 1.0000 | LOZENGE | OROMUCOSAL | Status: DC | PRN

## 2024-10-06 MED ORDER — KETAMINE HCL 50 MG/5ML IJ SOSY
PREFILLED_SYRINGE | INTRAMUSCULAR | Status: AC
  Filled 2024-10-06: qty 5

## 2024-10-06 MED ORDER — CEFAZOLIN SODIUM-DEXTROSE 2-4 GM/100ML-% IV SOLN
2.0000 g | Freq: Once | INTRAVENOUS | Status: AC
  Administered 2024-10-06: 2 g via INTRAVENOUS

## 2024-10-06 MED ORDER — ACETAMINOPHEN 10 MG/ML IV SOLN
INTRAVENOUS | Status: DC | PRN
  Administered 2024-10-06: 1000 mg via INTRAVENOUS

## 2024-10-06 MED ORDER — DEXMEDETOMIDINE HCL IN NACL 80 MCG/20ML IV SOLN
INTRAVENOUS | Status: DC | PRN
Start: 2024-10-06 — End: 2024-10-06
  Administered 2024-10-06: 8 ug via INTRAVENOUS

## 2024-10-06 MED ORDER — ROCURONIUM BROMIDE 100 MG/10ML IV SOLN
INTRAVENOUS | Status: DC | PRN
  Administered 2024-10-06: 60 mg via INTRAVENOUS
  Administered 2024-10-06 (×2): 10 mg via INTRAVENOUS

## 2024-10-06 MED ORDER — FENTANYL CITRATE (PF) 100 MCG/2ML IJ SOLN
INTRAMUSCULAR | Status: DC | PRN
  Administered 2024-10-06 (×2): 50 ug via INTRAVENOUS

## 2024-10-06 MED ORDER — SODIUM CHLORIDE 0.9 % IV SOLN: INTRAVENOUS | Status: DC

## 2024-10-06 MED ORDER — DEXAMETHASONE SODIUM PHOSPHATE 10 MG/ML IJ SOLN
INTRAMUSCULAR | Status: DC | PRN
  Administered 2024-10-06: 10 mg via INTRAVENOUS

## 2024-10-06 MED ORDER — SUGAMMADEX SODIUM 200 MG/2ML IV SOLN
INTRAVENOUS | Status: DC | PRN
  Administered 2024-10-06: 150 mg via INTRAVENOUS

## 2024-10-06 MED ORDER — DROPERIDOL 2.5 MG/ML IJ SOLN
INTRAMUSCULAR | Status: AC
  Filled 2024-10-06: qty 2

## 2024-10-06 MED ORDER — DOCUSATE SODIUM 100 MG PO CAPS
100.0000 mg | ORAL_CAPSULE | Freq: Two times a day (BID) | ORAL | Status: DC
  Administered 2024-10-06: 100 mg via ORAL
  Filled 2024-10-06: qty 1

## 2024-10-06 SURGICAL SUPPLY — 51 items
BLADE SAW SGTL 13X75X1.27 (BLADE) ×1 IMPLANT
BNDG COHESIVE 4X5 TAN STRL LF (GAUZE/BANDAGES/DRESSINGS) ×1 IMPLANT
BOWL CEMENT MIXING ADV NOZZLE (MISCELLANEOUS) ×1 IMPLANT
CEMENT BONE 10-PACK (Cement) ×2 IMPLANT
CHLORAPREP W/TINT 26 (MISCELLANEOUS) ×2 IMPLANT
COVER MAYO STAND STRL (DRAPES) ×1 IMPLANT
DRAPE INCISE IOBAN 66X60 STRL (DRAPES) ×1 IMPLANT
DRAPE SHEET LG 3/4 BI-LAMINATE (DRAPES) ×1 IMPLANT
DRAPE SURG ORHT 6 SPLT 77X108 (DRAPES) ×1 IMPLANT
DRAPE TABLE BACK 80X90 (DRAPES) ×1 IMPLANT
DRSG OPSITE POSTOP 4X10 (GAUZE/BANDAGES/DRESSINGS) IMPLANT
DRSG OPSITE POSTOP 4X8 (GAUZE/BANDAGES/DRESSINGS) IMPLANT
ELECTRODE REM PT RTRN 9FT ADLT (ELECTROSURGICAL) ×1 IMPLANT
GAUZE XEROFORM 1X8 LF (GAUZE/BANDAGES/DRESSINGS) ×1 IMPLANT
GLOVE BIOGEL PI IND STRL 8 (GLOVE) ×2 IMPLANT
GLOVE ORTHO TXT STRL SZ7.5 (GLOVE) ×2 IMPLANT
GLOVE SKINSENSE STRL SZ8.0 LF (GLOVE) ×1 IMPLANT
GOWN SRG LRG LVL 4 IMPRV REINF (GOWNS) ×1 IMPLANT
GOWN SRG XL LONG LVL 3 NONREIN (GOWNS) ×1 IMPLANT
HEAD UNPLR 49XMDLR STRL HIP (Orthopedic Implant) IMPLANT
HOOD PEEL AWAY T7 (MISCELLANEOUS) ×2 IMPLANT
IMMBOLIZER KNEE 19 BLUE UNIV (SOFTGOODS) ×1 IMPLANT
KIT DRAIN HEMOVAC JP 7FR 400ML (MISCELLANEOUS) IMPLANT
KIT PREP HIP W/CEMENT RESTRICT (Miscellaneous) ×1 IMPLANT
KIT TURNOVER KIT A (KITS) ×1 IMPLANT
MANIFOLD NEPTUNE II (INSTRUMENTS) ×1 IMPLANT
MAT ABSORB FLUID 56X50 GRAY (MISCELLANEOUS) ×1 IMPLANT
NDL HYPO 22X1.5 SAFETY MO (MISCELLANEOUS) ×1 IMPLANT
NDL MAYO CATGUT SZ1 (NEEDLE) ×1 IMPLANT
NEEDLE HYPO 22X1.5 SAFETY MO (MISCELLANEOUS) ×1 IMPLANT
NEEDLE MAYO CATGUT SZ1 (NEEDLE) ×1 IMPLANT
NS IRRIG 500ML POUR BTL (IV SOLUTION) ×1 IMPLANT
PACK HIP PROSTHESIS (MISCELLANEOUS) ×1 IMPLANT
PENCIL SMOKE EVACUATOR (MISCELLANEOUS) ×1 IMPLANT
SLEEVE UNITRAX V40 +8 (Orthopedic Implant) IMPLANT
SOL .9 NS 3000ML IRR UROMATIC (IV SOLUTION) ×1 IMPLANT
SOLN STERILE WATER 1000 ML (IV SOLUTION) ×1 IMPLANT
SOLN STERILE WATER BTL 1000 ML (IV SOLUTION) ×1 IMPLANT
SPACER OSTEO CEMENT 14 HIP (Orthopedic Implant) IMPLANT
STAPLER SKIN PROX 35W (STAPLE) ×1 IMPLANT
STEM HIP ACCOLADE SZ5 37X145 (Stem) IMPLANT
SUT ETHIBOND #5 BRAIDED 30INL (SUTURE) ×1 IMPLANT
SUT VIC AB 0 CT1 36 (SUTURE) ×1 IMPLANT
SUT VIC AB 2-0 CT2 27 (SUTURE) ×2 IMPLANT
SYR 20ML LL LF (SYRINGE) ×2 IMPLANT
TAPE MICROFOAM 4IN (TAPE) ×1 IMPLANT
TIP FAN IRRIG PULSAVAC PLUS (DISPOSABLE) ×1 IMPLANT
TRAP FLUID SMOKE EVACUATOR (MISCELLANEOUS) ×1 IMPLANT
TRAY FOLEY SLVR 16FR LF STAT (SET/KITS/TRAYS/PACK) IMPLANT
TUBE KAMVAC SUCTION (TUBING) ×1 IMPLANT
TUBE SUCT KAM VAC (TUBING) ×1 IMPLANT

## 2024-10-06 NOTE — Op Note (Signed)
 DATE OF SURGERY: 10/06/2024  PREOPERATIVE DIAGNOSIS: Right femoral neck fracture  POSTOPERATIVE DIAGNOSIS: Right femoral neck fracture  PROCEDURE: Right hip hemiarthroplasty  SURGEON: Earnestine HILARIO Blanch, MD  ASSISTANTS:   EBL: 100 cc  COMPONENTS:  Stryker - Accolade C Size 5 Stem Stryker - Unitrax 48mm head with +8 offset neck   INDICATIONS: Loretta Ramsey is a 78 y.o. female who sustained a displaced femoral neck fracture after a fall. Risks and benefits of hip hemiarthroplasty were explained to the patient and/or family. Risks include but are not limited to bleeding, infection, injury to tissues, nerves, vessels, periprosthetic infection, dislocation, limb length discrepancy and risks of anesthesia. The patient and/or family understands these risks, has completed an informed consent and wishes to proceed.   PROCEDURE:  The patient was identified in the preoperative holding area and the operative extremity was marked.  The patient was then transferred to the operating room suite and mobilized from the hospital gurney to the operating room table. Spinal anesthesia was administered without complication. The patient was then transitioned to a lateral position.  All bony prominences were padded per protocol.  An axillary roll was placed.  Careful attention was paid to the contralateral side peroneal nerve, which was free from pressure with use of appropriate padding and blankets. A time-out was performed to confirm the patient's identity and the correct laterality of surgery. The patient was then prepped and draped in the usual sterile fashion. Appropriate pre-operative antibiotics were administered. Tranexamic acid was administered preoperatively.    An incision that centered on the posterior tip of the greater trochanter with a posterior curve was made. Dissection was carried down through the subcutaneous tissue.  Careful attention was made to maintain hemostasis using electrocautery.  Dissection  brought us  to the level of the deep fascia where the gluteus maximus muscle and proximal portion of the IT band were identified.  The proximal region of the IT band was incised in linear fashion and this incision was extended proximally in a curvilinear fashion to split the gluteus maximus muscle parallel to its fibers to minimize bleeding.  This was accomplished using a combination bovie electrocautery as well as blunt dissection.  The trochanteric bursa was then visualized and dissected from anterior to posterior. A blunt homan retractor was placed beneath the abductors. The piriformis tendon and short external rotators were visualized. Bovie electrocautery was used to cut these with the capsule as one L-shaped flap. This was tagged at the corner with #5 Ethibond. At this point, the femoral neck fracture was visualized. An oscillating saw was used to make a new neck cut approximately 15mm above the lesser tuberosity with the use of a neck cut guide. The head was then freed from its remaining soft tissue attachments and measured. The head trial was then inserted into the acetabulum and sized appropriately.    We then turned our attention to preparing the femoral canal. First, a box cut was performed utilizing the box osteotome. A canal finder was inserted by hand and sequential broaching was then performed. The calcar planer was inserted onto the broach and used to smooth the calcar appropriately.  A trial stem, neutral neck, and head were inserted into the acetabulum and placed through range of motion. Intraoperative radiographs were taken to assess hardware position and leg lengths. The hip was again dislocated and the femoral trial components were removed. An appropriately sized cement restrictor was placed. The canal was irrigated with pulse lavage and dried. A cement gun was  used to place cement into the femoral canal. Cement was then pressurized. The actual stem was inserted into the femoral canal and then  driven onto the calcar. Position was held until the cement hardened. The trial head was then again inserted on the femoral component and found to be appropriate. Trial was removed and the permanent head/neck was Morse tapered onto the femoral stem and then reduced into the acetabulum.    The hip stability and length were reassessed and found to be satisfactory.  The wound was then copiously irrigated with normal saline solution. The tagged sutures of the capsule and piriformis were sewn to the gluteus medius tendon. This adequately closed the hip capsule. The IT band and gluteus maximus fascia were then closed with 0-Vicryl in a running, locked fashion. A mixture of Exparel and bupivicaine was administered.  The subdermal layer was closed with 2-0 Vicryl in a buried interrupted fashion. Skin was approximated with staples.  Sterile dressing was applied. An abduction pillow was placed. The patient was mobilized from the lateral position back to supine on the operating room table and then awakened from general anesthesia without complication.   POSTOPERATIVE PLAN: The patient will be WBAT on operative extremity. Lovenox 40mg /day x 4 weeks to start on POD#1. IV Abx x 24 hours. PT/OT on POD#1. Posterior hip precautions.

## 2024-10-06 NOTE — Transfer of Care (Signed)
 Immediate Anesthesia Transfer of Care Note  Patient: Loretta Ramsey  Procedure(s) Performed: HEMIARTHROPLASTY (BIPOLAR) HIP, POSTERIOR APPROACH FOR FRACTURE (Right: Hip)  Patient Location: PACU  Anesthesia Type:General  Level of Consciousness: awake, alert , and drowsy  Airway & Oxygen Therapy: Patient Spontanous Breathing and Patient connected to face mask oxygen  Post-op Assessment: Report given to RN and Post -op Vital signs reviewed and stable  Post vital signs: Reviewed and stable  Last Vitals:  Vitals Value Taken Time  BP 171/85 10/06/24 15:48  Temp 36.8 C 10/06/24 15:42  Pulse 96 10/06/24 15:48  Resp 14 10/06/24 15:48  SpO2 100 % 10/06/24 15:48  Vitals shown include unfiled device data.  Last Pain:  Vitals:   10/06/24 1310  TempSrc:   PainSc: 9          Complications: No notable events documented.

## 2024-10-06 NOTE — Anesthesia Preprocedure Evaluation (Signed)
 Anesthesia Evaluation  Patient identified by MRN, date of birth, ID band Patient awake    Reviewed: Allergy & Precautions, NPO status , Patient's Chart, lab work & pertinent test results  History of Anesthesia Complications Negative for: history of anesthetic complications  Airway Mallampati: III  TM Distance: <3 FB Neck ROM: full    Dental  (+) Chipped   Pulmonary neg shortness of breath, former smoker   Pulmonary exam normal        Cardiovascular Exercise Tolerance: Good hypertension, (-) angina (-) Past MI Normal cardiovascular exam     Neuro/Psych negative neurological ROS     GI/Hepatic negative GI ROS, Neg liver ROS,,,  Endo/Other  negative endocrine ROS    Renal/GU      Musculoskeletal   Abdominal   Peds  Hematology negative hematology ROS (+)   Anesthesia Other Findings Past Medical History: No date: Arteriovenous malformation of liver No date: B12 deficiency No date: Cervical disc disease No date: Depression No date: Hypertension No date: Osteoarthritis  Past Surgical History: No date: ABDOMINAL HYSTERECTOMY No date: APPENDECTOMY 12/31/2013: BREAST CYST ASPIRATION; Left No date: COLONOSCOPY 02/10/2016: COLONOSCOPY WITH PROPOFOL ; N/A     Comment:  Procedure: COLONOSCOPY WITH PROPOFOL ;  Surgeon: Lamar ONEIDA Holmes, MD;  Location: [facility] ENDOSCOPY;  Service:               Endoscopy;  Laterality: N/A; 09/19/2021: COLONOSCOPY WITH PROPOFOL ; N/A     Comment:  Procedure: COLONOSCOPY WITH PROPOFOL ;  Surgeon:               Maryruth Ole ONEIDA, MD;  Location: [facility] ENDOSCOPY;                Service: Endoscopy;  Laterality: N/A;  BMI    Body Mass Index: 24.69 kg/m      Reproductive/Obstetrics negative OB ROS                              Anesthesia Physical Anesthesia Plan  ASA: 2  Anesthesia Plan: General ETT   Post-op Pain Management:    Induction:  Intravenous  PONV Risk Score and Plan: Ondansetron, Dexamethasone, Midazolam  and Treatment may vary due to age or medical condition  Airway Management Planned: Oral ETT  Additional Equipment:   Intra-op Plan:   Post-operative Plan: Extubation in OR  Informed Consent: I have reviewed the patients History and Physical, chart, labs and discussed the procedure including the risks, benefits and alternatives for the proposed anesthesia with the patient or authorized representative who has indicated his/her understanding and acceptance.     Dental Advisory Given  Plan Discussed with: Anesthesiologist, CRNA and Surgeon  Anesthesia Plan Comments: (Patient consented for risks of anesthesia including but not limited to:  - adverse reactions to medications - damage to eyes, teeth, lips or other oral mucosa - nerve damage due to positioning  - sore throat or hoarseness - Damage to heart, brain, nerves, lungs, other parts of body or loss of life  Patient voiced understanding and assent.)        Anesthesia Quick Evaluation

## 2024-10-06 NOTE — TOC Initial Note (Signed)
 Transition of Care [facility]) - Initial/Assessment Note    Patient Details  Name: Loretta Ramsey MRN: [id number] Date of Birth: 1946/04/14  [facility] CM/SW Contact:    Racheal LITTIE Schimke, RN Phone Number: 10/06/2024, 11:19 AM  Clinical Narrative: Spoke with patient at bedside. She voices living with Husband in [facility]. Independent with ADL's no DME, no receptive to SNF, prefers to [facility] if needed, no preference. No wounds PCP listed on file.                       Patient Goals and CMS Choice            Expected Discharge Plan and Services                                              Prior Living Arrangements/Services                       Activities of Daily Living      Permission Sought/Granted                  Emotional Assessment              Admission diagnosis:  Right hip pain [M25.551] Right flank pain [R10.A1] Right-sided chest wall pain [R07.89] Fall, initial encounter [W19.XXXA] Closed fracture of right hip, initial encounter (HCC) [S72.001A] Closed displaced fracture of right femoral neck (HCC) [S72.001A] Patient Active Problem List   Diagnosis Date Noted   Closed displaced fracture of right femoral neck (HCC) 10/05/2024   PCP:  Cleotilde Oneil FALCON, MD Pharmacy:   [facility] - [place], [place] - [address] [place] [address] Phone: [phone] Fax: [phone]     Social Drivers of Health (SDOH) Social History: SDOH Screenings   Food Insecurity: No Food Insecurity (09/28/2024)   Received from [facility]  Housing: Low Risk  (09/28/2024)   Received from [facility]  Transportation Needs: No Transportation Needs (09/28/2024)   Received from [facility]  Utilities: Not At Risk (09/28/2024)   Received [facility]  Financial Resource Strain: Low Risk  (09/28/2024)   Received from [facility]  Tobacco Use: Medium Risk (10/05/2024)   SDOH Interventions:     Readmission Risk Interventions     No data to display

## 2024-10-06 NOTE — Anesthesia Procedure Notes (Signed)
 Procedure Name: Intubation Date/Time: 10/06/2024 1:42 PM  Performed by: Jackye Spanner, CRNAPre-anesthesia Checklist: Patient identified, Patient being monitored, Timeout performed, Emergency Drugs available and Suction available Patient Re-evaluated:Patient Re-evaluated prior to induction Oxygen Delivery Method: Circle system utilized Preoxygenation: Pre-oxygenation with 100% oxygen Induction Type: IV induction Ventilation: Mask ventilation without difficulty Laryngoscope Size: 3 and McGrath Grade View: Grade I Tube type: Oral Tube size: 7.0 mm Number of attempts: 1 Airway Equipment and Method: Stylet Placement Confirmation: ETT inserted through vocal cords under direct vision, positive ETCO2 and breath sounds checked- equal and bilateral Secured at: 21 cm Tube secured with: Tape Dental Injury: Teeth and Oropharynx as per pre-operative assessment  Comments: Smooth atraumatic intubation, no complications noted.

## 2024-10-06 NOTE — H&P (Addendum)
 H&P reviewed. No significant changes noted. I will take over care of this patient from Dr. Maryrose. Plan for OR later today for hip hemiarthroplasty.

## 2024-10-06 NOTE — Progress Notes (Signed)
 PROGRESS NOTE    [name]  FMW:[id number] DOB: 06/21/1946 DOA: 10/05/2024 PCP: Cleotilde Oneil FALCON, MD  Chief Complaint  Patient presents with   Hip Pain    Hospital Course:  Loretta Ramsey is a 78 y.o. female with medical history significant of GERD, Depression, urinary incontinence presented with right-sided pain after she slipped and fell on the concrete porch. Admitted for right femoral neck fracture s/p mechanical fall  Subjective: Patient was seen at the bedside, was awaiting procedure by orthopedic States was in pain, has prn meds available   Objective: Vitals:   10/06/24 1615 10/06/24 1630 10/06/24 1645 10/06/24 1700  BP: 134/70 (!) 151/66 137/66   Pulse: 82 76 81   Resp: 11 18 (!) 21   Temp:    (!) 97.2 F (36.2 C)  TempSrc:      SpO2: 95% 100% 100%   Weight:      Height:        Intake/Output Summary (Last 24 hours) at 10/06/2024 1722 Last data filed at 10/06/2024 1700 Gross per 24 hour  Intake 1900 ml  Output 100 ml  Net 1800 ml   Filed Weights   10/05/24 1357  Weight: 69.4 kg    Examination: General: Awake, no distress.  CV:  RRR, no murmurs heard, no pedal edema Resp: clear to auscultation bilaterally Abd: No distention.  Soft, nontender MSK: Right hip and flank TTP, ROM dec due to pain, skin abrasion R elbow Neuro: AO x 3, no gross focal deficits. RLE limited due to pain  Assessment & Plan:  Right femoral neck fracture - s/p Mechanical Fall - Trauma work up negative except Acute Right femoral neck fracture with impaction and varus angulation of the distal fragment - Seen by Orthopedics, plan for Right hip hemiarthroplasty today - Pain management, Antiemetics prn - PT/OT when ok per Ortho   Elevated BP - Not on meds at home, likely elevated due to pain - IV Hydralazine with holding parameters - Monitor BP   Mild leukocytosis - likely reactive, no infectious symptoms - Monitor WBC   GERD - PPI   Depression - Wellbutrin, Trazodone    Incidental findings - 1.2 cm diameter ground glass nodule in RUL.  Follow-up with CT in 6 months  DVT prophylaxis: SCD   Code Status: Full Code Disposition:  TBD  Consultants:  Treatment Team:  Consulting Physician: Maryrose Cordella Charleston, MD  Procedures:  R hip hemiarthroplasty  Antimicrobials:  Anti-infectives (From admission, onward)    Start     Dose/Rate Route Frequency Ordered Stop   10/07/24 1200  [MAR Hold]  ceFAZolin (ANCEF) IVPB 2g/100 mL premix        (MAR Hold since Tue 10/06/2024 at 1144.Hold Reason: Transfer to a Procedural area)   2 g 200 mL/hr over 30 Minutes Intravenous  Once 10/06/24 0735 10/06/24 1405   10/06/24 0600  [MAR Hold]  ceFAZolin (ANCEF) IVPB 2g/100 mL premix        (MAR Hold since Tue 10/06/2024 at 1144.Hold Reason: Transfer to a Procedural area)   2 g 200 mL/hr over 30 Minutes Intravenous 30 min pre-op 10/05/24 2003         Data Reviewed: I have personally reviewed following labs and imaging studies CBC: Recent Labs  Lab 10/05/24 1405 10/06/24 0405  WBC 11.0* 8.3  NEUTROABS 7.7  --   HGB 12.5 11.3*  HCT 38.6 34.5*  MCV 106.9* 105.2*  PLT 273 212   Basic Metabolic Panel: Recent Labs  Lab 10/05/24 1405 10/06/24 0405  NA 136 137  K 4.2 3.9  CL 98 101  CO2 24 27  GLUCOSE 92 111*  BUN 25* 21  CREATININE 0.89 0.68  CALCIUM 9.5 8.7*   GFR: Estimated Creatinine Clearance: 53.4 mL/min (by C-G formula based on SCr of 0.68 mg/dL). Liver Function Tests: No results for input(s): AST, ALT, ALKPHOS, BILITOT, PROT, ALBUMIN in the last 168 hours. CBG: No results for input(s): GLUCAP in the last 168 hours.  No results found for this or any previous visit (from the past 240 hours).   Radiology Studies: CT Cervical Spine Wo Contrast Result Date: 10/05/2024 CLINICAL DATA:  Neck trauma (Age >= 65y) Slip and fall. EXAM: CT CERVICAL SPINE WITHOUT CONTRAST TECHNIQUE: Multidetector CT imaging of the cervical spine was performed  without intravenous contrast. Multiplanar CT image reconstructions were also generated. RADIATION DOSE REDUCTION: This exam was performed according to the departmental dose-optimization program which includes automated exposure control, adjustment of the mA and/or kV according to patient size and/or use of iterative reconstruction technique. COMPARISON:  None Available. FINDINGS: Alignment: Straightening and broad-based reversal of normal lordosis. Degenerative grade 1 anterolisthesis of C2 on C3 and C3 on C4. No traumatic subluxation. Skull base and vertebrae: No acute fracture. Vertebral body heights are maintained. The dens and skull base are intact. Soft tissues and spinal canal: No prevertebral fluid or swelling. No visible canal hematoma. Disc levels: Degenerative disc disease C4-C5 through C7-T1. Multilevel facet hypertrophy. Ligamentum flavum calcification posteriorly at C6 level. Combination of disc disease and posterior calcification causes narrowing of the spinal canal. Upper chest: Assessed on concurrent chest CT, reported separately. Other: None. IMPRESSION: 1. No acute fracture or traumatic subluxation of the cervical spine. 2. Multilevel degenerative disc disease and facet hypertrophy. Electronically Signed   By: Andrea Gasman M.D.   On: 10/05/2024 16:46   CT CHEST ABDOMEN PELVIS W CONTRAST Result Date: 10/05/2024 CLINICAL DATA:  Poly trauma, blunt. Right hip pain after slip and fall injury. EXAM: CT CHEST, ABDOMEN, AND PELVIS WITH CONTRAST TECHNIQUE: Multidetector CT imaging of the chest, abdomen and pelvis was performed following the standard protocol during bolus administration of intravenous contrast. RADIATION DOSE REDUCTION: This exam was performed according to the departmental dose-optimization program which includes automated exposure control, adjustment of the mA and/or kV according to patient size and/or use of iterative reconstruction technique. CONTRAST:  OMNIPAQUE  IOHEXOL  300  MG/ML  SOLN COMPARISON:  MRI abdomen 04/14/2019. CT abdomen and pelvis 04/13/2019. FINDINGS: CT CHEST FINDINGS Cardiovascular: No significant vascular findings. Normal heart size. No pericardial effusion. Mediastinum/Nodes: No enlarged mediastinal, hilar, or axillary lymph nodes. Thyroid  gland, trachea, and esophagus demonstrate no significant findings. Lungs/Pleura: Scattered emphysematous changes in the lungs. Scarring in the lung apices. 1.2 cm diameter ground-glass nodule in the right upper lung, series 4, image 95. This area was not included in the prior studies for comparison purposes. No other focal pulmonary nodules. No pleural effusion or pneumothorax. Musculoskeletal: Degenerative changes in the spine. No vertebral compression deformities. Ribs and sternum are nondepressed. CT ABDOMEN PELVIS FINDINGS Hepatobiliary: Multiple scattered low-attenuation liver lesions are unchanged since prior study, likely cyst. Focal hyperenhancing lesion seen previously in the right lobe of the liver is not identified today, likely due to differences in bolus timing. Gallbladder and bile ducts are normal. Pancreas: Unremarkable. No pancreatic ductal dilatation or surrounding inflammatory changes. Spleen: Normal in size without focal abnormality. Adrenals/Urinary Tract: No adrenal gland nodules. Numerous bilateral renal cysts, largest measuring up  to about 2.5 cm diameter. No change since prior study. No imaging follow-up is indicated. Nephrograms are otherwise homogeneous. No hydronephrosis or hydroureter. Bladder is normal. Stomach/Bowel: Stomach, small bowel, and colon are not abnormally distended. No wall thickening or inflammatory stranding. Stool fills the colon. Appendix is not identified. Vascular/Lymphatic: Aortic atherosclerosis. No enlarged abdominal or pelvic lymph nodes. Reproductive: Status post hysterectomy. No adnexal masses. Other: No abdominal wall hernia or abnormality. No abdominopelvic ascites.  Musculoskeletal: Degenerative changes in the lumbar spine. No vertebral compression deformities. Transverse fracture of the right femoral neck extending to the base of the femoral neck but without obvious inter trochanteric involvement. Varus angulation. Sacrum and pelvis appear intact. IMPRESSION: 1. Acute transverse fracture of the right femoral neck without inter trochanteric involvement. 2. Otherwise, no acute posttraumatic changes demonstrated in the chest, abdomen, or pelvis. 3. 1.2 cm diameter ground-glass nodule in the right upper lung. Initial follow-up with CT at 6 months is recommended to confirm persistence. If persistent, repeat CT is recommended every 2 years until 5 years of stability has been established. This recommendation follows the consensus statement: Guidelines for Management of Incidental Pulmonary Nodules Detected on CT Images: From the Fleischner Society 2017; Radiology 2017; 284:228-243. 4. Additional incidental findings as above. Electronically Signed   By: Elsie Gravely M.D.   On: 10/05/2024 16:45   CT Head Wo Contrast Result Date: 10/05/2024 CLINICAL DATA:  Head trauma, minor (Age >= 65y) Slip and fall. EXAM: CT HEAD WITHOUT CONTRAST TECHNIQUE: Contiguous axial images were obtained from the base of the skull through the vertex without intravenous contrast. RADIATION DOSE REDUCTION: This exam was performed according to the departmental dose-optimization program which includes automated exposure control, adjustment of the mA and/or kV according to patient size and/or use of iterative reconstruction technique. COMPARISON:  Head CT 06/20/2018 FINDINGS: Brain: No intracranial hemorrhage, mass effect, or midline shift. Generalized atrophy. No hydrocephalus. The basilar cisterns are patent. Symmetric basal gangliar mineralization. Periventricular chronic small vessel ischemia. No evidence of territorial infarct or acute ischemia. No extra-axial or intracranial fluid collection. Vascular:  No hyperdense vessel or unexpected calcification. Skull: No fracture or focal lesion. Sinuses/Orbits: Paranasal sinuses and mastoid air cells are clear. The visualized orbits are unremarkable. Bilateral cataract resection. Other: No confluent scalp hematoma. IMPRESSION: 1. No acute intracranial abnormality. No skull fracture. 2. Generalized atrophy and chronic small vessel ischemia. Electronically Signed   By: Andrea Gasman M.D.   On: 10/05/2024 16:41   DG Elbow Complete Right Result Date: 10/05/2024 CLINICAL DATA:  Left elbow pain and skin tear following a fall. EXAM: RIGHT ELBOW - COMPLETE 3+ VIEW COMPARISON:  None Available. FINDINGS: There is no evidence of fracture, dislocation, or joint effusion. There is no evidence of arthropathy or other focal bone abnormality. Soft tissues are unremarkable. IMPRESSION: Negative. Electronically Signed   By: Elspeth Bathe M.D.   On: 10/05/2024 15:11   DG Hip Unilat  With Pelvis 2-3 Views Right Result Date: 10/05/2024 EXAM: 2 OR MORE VIEW(S) XRAY OF THE RIGHT HIP 10/05/2024 02:42:51 PM COMPARISON: CT of 4 / 13 / 20 CLINICAL HISTORY: fall. Fall, FINDINGS: BONES AND JOINTS: Acute right femoral neck fracture with impaction and varus angulation of the distal fracture fragment. The hip joint is maintained. No significant degenerative changes. SOFT TISSUES: The soft tissues are unremarkable. IMPRESSION: 1. Acute right femoral neck fracture with impaction and varus angulation of the distal fragment. Electronically signed by: Rockey Kilts MD 10/05/2024 03:10 PM EDT RP Workstation: [id number]  Scheduled Meds:  [MAR Hold] buPROPion  300 mg Oral Daily   [MAR Hold] pantoprazole  40 mg Oral QHS   [MAR Hold] sodium chloride  flush  3 mL Intravenous Q12H   [MAR Hold] traZODone  50 mg Oral QHS   Continuous Infusions:  [MAR Hold]  ceFAZolin (ANCEF) IV     lactated ringers Stopped (10/06/24 1539)     LOS: 1 day  MDM: Patient is high risk for one or more organ failure.   They necessitate ongoing hospitalization for continued IV therapies and subsequent lab monitoring. Total time spent interpreting labs and vitals, reviewing the medical record, coordinating care amongst consultants and care team members, directly assessing and discussing care with the patient and/or family: 35 min Laree Lock, MD [facility]  To contact the attending physician between 7A-7P please use Epic Chat. To contact the covering physician during after hours 7P-7A, please review Amion.  10/06/2024, 5:22 PM   *This document has been created with the assistance of dictation software. Please excuse typographical errors. *

## 2024-10-06 NOTE — Consult Note (Signed)
 ORTHOPAEDIC CONSULTATION  REQUESTING PHYSICIAN: Jerelene Critchley, MD  Chief Complaint:   R hip pain  History of Present Illness: Loretta Ramsey is a 78 y.o. female who had a fall yesterday after she tripped on her concrete porch while cleaning. The patient noted immediate hip pain and inability to ambulate.  The patient ambulates unassisted at baseline.  She lives at home with her husband. Pain is worse with any sort of movement.  X-rays in the emergency department show a right displaced femoral neck fracture.  Medical history significant for hypertension, GERD, Depression, urinary incontinence   Past Medical History:  Diagnosis Date   Arteriovenous malformation of liver    B12 deficiency    Cervical disc disease    Depression    Hypertension    Osteoarthritis    Past Surgical History:  Procedure Laterality Date   ABDOMINAL HYSTERECTOMY     APPENDECTOMY     BREAST CYST ASPIRATION Left 12/31/2013   COLONOSCOPY     COLONOSCOPY WITH PROPOFOL  N/A 02/10/2016   Procedure: COLONOSCOPY WITH PROPOFOL ;  Surgeon: Lamar ONEIDA Holmes, MD;  Location: [facility] ENDOSCOPY;  Service: Endoscopy;  Laterality: N/A;   COLONOSCOPY WITH PROPOFOL  N/A 09/19/2021   Procedure: COLONOSCOPY WITH PROPOFOL ;  Surgeon: Maryruth Ole ONEIDA, MD;  Location: [facility] ENDOSCOPY;  Service: Endoscopy;  Laterality: N/A;   Social History   Socioeconomic History   Marital status: Married    Spouse name: Not on file   Number of children: Not on file   Years of education: Not on file   Highest education level: Not on file  Occupational History   Not on file  Tobacco Use   Smoking status: Former   Smokeless tobacco: Not on file  Substance and Sexual Activity   Alcohol use: Yes   Drug use: No   Sexual activity: Not on file  Other Topics Concern   Not on file  Social History Narrative   Not on file   Social Drivers of Health   Financial Resource Strain:  Low Risk  (09/28/2024)   Received from [facility]   Overall Financial Resource Strain (CARDIA)    Difficulty of Paying Living Expenses: Not hard at all  Food Insecurity: Patient Declined (10/06/2024)   Hunger Vital Sign    Worried About Running Out of Food in the Last Year: Patient declined    Ran Out of Food in the Last Year: Patient declined  Transportation Needs: Unknown (10/06/2024)   PRAPARE - Administrator, Civil Service (Medical): Patient declined    Lack of Transportation (Non-Medical): Not on file  Physical Activity: Not on file  Stress: Not on file  Social Connections: Unknown (10/06/2024)   Social Connection and Isolation Panel    Frequency of Communication with Friends and Family: Patient declined    Frequency of Social Gatherings with Friends and Family: Patient declined    Attends Religious Services: Patient declined    Database administrator or Organizations: Not on file    Attends Banker Meetings: Not on file    Marital Status: Not on file   Family History  Problem Relation Age of Onset   Breast cancer Maternal Aunt    Allergies  Allergen Reactions   Ace Inhibitors Cough    cough   Codeine Nausea Only   Hydrocodone Nausea Only   Prior to Admission medications   Medication Sig Start Date End Date Taking? Authorizing Provider  acetaminophen (TYLENOL) 500 MG tablet Take 500  mg by mouth 3 (three) times daily as needed for mild pain (pain score 1-3).   Yes [provider]  ascorbic acid (VITAMIN C) 500 MG tablet Take 500 mg by mouth daily.   Yes [provider]  bumetanide (BUMEX) 0.5 MG tablet Take 0.5 mg by mouth daily. 09/29/24  Yes [provider]  buPROPion (WELLBUTRIN XL) 300 MG 24 hr tablet Take 300 mg by mouth daily.   Yes [provider]  meloxicam (MOBIC) 7.5 MG tablet Take 7.5 mg by mouth daily as needed for pain. 09/29/24 09/29/25 Yes [provider]  pantoprazole  (PROTONIX) 40 MG tablet Take 40 mg by mouth daily.   Yes [provider]  polyethylene glycol (MIRALAX / GLYCOLAX) 17 g packet Take 17 g by mouth daily.   Yes [provider]  tolterodine (DETROL) 2 MG tablet Take 4 mg by mouth daily.   Yes [provider]  traZODone (DESYREL) 50 MG tablet Take 50 mg by mouth at bedtime.   Yes [provider]  vitamin B-12 (CYANOCOBALAMIN ) 1000 MCG tablet Take 1,000 mcg by mouth daily.   Yes [provider]   Recent Labs    10/05/24 1405 10/06/24 0405  WBC 11.0* 8.3  HGB 12.5 11.3*  HCT 38.6 34.5*  PLT 273 212  K 4.2 3.9  CL 98 101  CO2 24 27  BUN 25* 21  CREATININE 0.89 0.68  GLUCOSE 92 111*  CALCIUM 9.5 8.7*   CT Cervical Spine Wo Contrast Result Date: 10/05/2024 CLINICAL DATA:  Neck trauma (Age >= 65y) Slip and fall. EXAM: CT CERVICAL SPINE WITHOUT CONTRAST TECHNIQUE: Multidetector CT imaging of the cervical spine was performed without intravenous contrast. Multiplanar CT image reconstructions were also generated. RADIATION DOSE REDUCTION: This exam was performed according to the departmental dose-optimization program which includes automated exposure control, adjustment of the mA and/or kV according to patient size and/or use of iterative reconstruction technique. COMPARISON:  None Available. FINDINGS: Alignment: Straightening and broad-based reversal of normal lordosis. Degenerative grade 1 anterolisthesis of C2 on C3 and C3 on C4. No traumatic subluxation. Skull base and vertebrae: No acute fracture. Vertebral body heights are maintained. The dens and skull base are intact. Soft tissues and spinal canal: No prevertebral fluid or swelling. No visible canal hematoma. Disc levels: Degenerative disc disease C4-C5 through C7-T1. Multilevel facet hypertrophy. Ligamentum flavum calcification posteriorly at C6 level. Combination of disc disease and posterior calcification causes narrowing of the spinal canal. Upper  chest: Assessed on concurrent chest CT, reported separately. Other: None. IMPRESSION: 1. No acute fracture or traumatic subluxation of the cervical spine. 2. Multilevel degenerative disc disease and facet hypertrophy. Electronically Signed   By: Andrea Gasman M.D.   On: 10/05/2024 16:46   CT CHEST ABDOMEN PELVIS W CONTRAST Result Date: 10/05/2024 CLINICAL DATA:  Poly trauma, blunt. Right hip pain after slip and fall injury. EXAM: CT CHEST, ABDOMEN, AND PELVIS WITH CONTRAST TECHNIQUE: Multidetector CT imaging of the chest, abdomen and pelvis was performed following the standard protocol during bolus administration of intravenous contrast. RADIATION DOSE REDUCTION: This exam was performed according to the departmental dose-optimization program which includes automated exposure control, adjustment of the mA and/or kV according to patient size and/or use of iterative reconstruction technique. CONTRAST:  OMNIPAQUE  IOHEXOL  300 MG/ML  SOLN COMPARISON:  MRI abdomen 04/14/2019. CT abdomen and pelvis 04/13/2019. FINDINGS: CT CHEST FINDINGS Cardiovascular: No significant vascular findings. Normal heart size. No pericardial effusion. Mediastinum/Nodes: No enlarged mediastinal, hilar,  or axillary lymph nodes. Thyroid  gland, trachea, and esophagus demonstrate no significant findings. Lungs/Pleura: Scattered emphysematous changes in the lungs. Scarring in the lung apices. 1.2 cm diameter ground-glass nodule in the right upper lung, series 4, image 95. This area was not included in the prior studies for comparison purposes. No other focal pulmonary nodules. No pleural effusion or pneumothorax. Musculoskeletal: Degenerative changes in the spine. No vertebral compression deformities. Ribs and sternum are nondepressed. CT ABDOMEN PELVIS FINDINGS Hepatobiliary: Multiple scattered low-attenuation liver lesions are unchanged since prior study, likely cyst. Focal hyperenhancing lesion seen previously in the right lobe of the  liver is not identified today, likely due to differences in bolus timing. Gallbladder and bile ducts are normal. Pancreas: Unremarkable. No pancreatic ductal dilatation or surrounding inflammatory changes. Spleen: Normal in size without focal abnormality. Adrenals/Urinary Tract: No adrenal gland nodules. Numerous bilateral renal cysts, largest measuring up to about 2.5 cm diameter. No change since prior study. No imaging follow-up is indicated. Nephrograms are otherwise homogeneous. No hydronephrosis or hydroureter. Bladder is normal. Stomach/Bowel: Stomach, small bowel, and colon are not abnormally distended. No wall thickening or inflammatory stranding. Stool fills the colon. Appendix is not identified. Vascular/Lymphatic: Aortic atherosclerosis. No enlarged abdominal or pelvic lymph nodes. Reproductive: Status post hysterectomy. No adnexal masses. Other: No abdominal wall hernia or abnormality. No abdominopelvic ascites. Musculoskeletal: Degenerative changes in the lumbar spine. No vertebral compression deformities. Transverse fracture of the right femoral neck extending to the base of the femoral neck but without obvious inter trochanteric involvement. Varus angulation. Sacrum and pelvis appear intact. IMPRESSION: 1. Acute transverse fracture of the right femoral neck without inter trochanteric involvement. 2. Otherwise, no acute posttraumatic changes demonstrated in the chest, abdomen, or pelvis. 3. 1.2 cm diameter ground-glass nodule in the right upper lung. Initial follow-up with CT at 6 months is recommended to confirm persistence. If persistent, repeat CT is recommended every 2 years until 5 years of stability has been established. This recommendation follows the consensus statement: Guidelines for Management of Incidental Pulmonary Nodules Detected on CT Images: From the Fleischner Society 2017; Radiology 2017; 284:228-243. 4. Additional incidental findings as above. Electronically Signed   By: Elsie Gravely M.D.   On: 10/05/2024 16:45   CT Head Wo Contrast Result Date: 10/05/2024 CLINICAL DATA:  Head trauma, minor (Age >= 65y) Slip and fall. EXAM: CT HEAD WITHOUT CONTRAST TECHNIQUE: Contiguous axial images were obtained from the base of the skull through the vertex without intravenous contrast. RADIATION DOSE REDUCTION: This exam was performed according to the departmental dose-optimization program which includes automated exposure control, adjustment of the mA and/or kV according to patient size and/or use of iterative reconstruction technique. COMPARISON:  Head CT 06/20/2018 FINDINGS: Brain: No intracranial hemorrhage, mass effect, or midline shift. Generalized atrophy. No hydrocephalus. The basilar cisterns are patent. Symmetric basal gangliar mineralization. Periventricular chronic small vessel ischemia. No evidence of territorial infarct or acute ischemia. No extra-axial or intracranial fluid collection. Vascular: No hyperdense vessel or unexpected calcification. Skull: No fracture or focal lesion. Sinuses/Orbits: Paranasal sinuses and mastoid air cells are clear. The visualized orbits are unremarkable. Bilateral cataract resection. Other: No confluent scalp hematoma. IMPRESSION: 1. No acute intracranial abnormality. No skull fracture. 2. Generalized atrophy and chronic small vessel ischemia. Electronically Signed   By: Andrea Gasman M.D.   On: 10/05/2024 16:41   DG Elbow Complete Right Result Date: 10/05/2024 CLINICAL DATA:  Left elbow pain and skin tear following a fall. EXAM: RIGHT ELBOW - COMPLETE 3+  VIEW COMPARISON:  None Available. FINDINGS: There is no evidence of fracture, dislocation, or joint effusion. There is no evidence of arthropathy or other focal bone abnormality. Soft tissues are unremarkable. IMPRESSION: Negative. Electronically Signed   By: Elspeth Bathe M.D.   On: 10/05/2024 15:11   DG Hip Unilat  With Pelvis 2-3 Views Right Result Date: 10/05/2024 EXAM: 2 OR MORE VIEW(S)  XRAY OF THE RIGHT HIP 10/05/2024 02:42:51 PM COMPARISON: CT of 4 / 13 / 20 CLINICAL HISTORY: fall. Fall, FINDINGS: BONES AND JOINTS: Acute right femoral neck fracture with impaction and varus angulation of the distal fracture fragment. The hip joint is maintained. No significant degenerative changes. SOFT TISSUES: The soft tissues are unremarkable. IMPRESSION: 1. Acute right femoral neck fracture with impaction and varus angulation of the distal fragment. Electronically signed by: Rockey Kilts MD 10/05/2024 03:10 PM EDT RP Workstation: [id number]     Positive ROS: All other systems have been reviewed and were otherwise negative with the exception of those mentioned in the HPI and as above.  Physical Exam: BP (!) 173/63   Pulse 79   Temp 97.9 F (36.6 C)   Resp 14   Ht 5' 6 (1.676 m)   Wt 69.4 kg   SpO2 97%   BMI 24.69 kg/m  General:  Alert, no acute distress Psychiatric:  Patient is competent for consent with normal mood and affect     Orthopedic Exam:  RLE: + DF/PF/EHL SILT grossly over foot Foot wwp +Log roll/axial load Leg shortened and externally rotated   Imaging:  As above: R displaced femoral neck fracture  Assessment/Plan: Loretta Ramsey is a 78 y.o. female with a R displaced femoral neck fracture   1. I discussed the various treatment options including both surgical and non-surgical management of the fracture with the patient. We discussed the high risk of perioperative complications due to patient's age and other co-morbidities. After discussion of risks, benefits, and alternatives to surgery, the family and/or patient were in agreement to proceed with surgery. The goals of surgery would be to provide adequate pain relief and allow for mobilization. Plan for surgery is R hip hemiarthroplasty today, 10/06/2024. 2. NPO until OR 3. Hold anticoagulation in advance of OR   Earnestine Blanch   10/06/2024 1:18 PM

## 2024-10-07 ENCOUNTER — Encounter: Payer: Self-pay | Admitting: Orthopedic Surgery

## 2024-10-07 DIAGNOSIS — S72001A Fracture of unspecified part of neck of right femur, initial encounter for closed fracture: Secondary | ICD-10-CM | POA: Diagnosis not present

## 2024-10-07 LAB — BASIC METABOLIC PANEL WITH GFR
Anion gap: 7 (ref 5–15)
BUN: 17 mg/dL (ref 8–23)
CO2: 28 mmol/L (ref 22–32)
Calcium: 8.9 mg/dL (ref 8.9–10.3)
Chloride: 102 mmol/L (ref 98–111)
Creatinine, Ser: 0.57 mg/dL (ref 0.44–1.00)
GFR, Estimated: >60 mL/min (ref >60)
Glucose, Bld: 130 mg/dL — ABNORMAL HIGH (ref 70–99)
Potassium: 4.4 mmol/L (ref 3.5–5.1)
Sodium: 137 mmol/L (ref 135–145)

## 2024-10-07 LAB — CBC
HCT: 32 % — ABNORMAL LOW (ref 36.0–46.0)
Hemoglobin: 10.3 g/dL — ABNORMAL LOW (ref 12.0–15.0)
MCH: 33.8 pg (ref 26.0–34.0)
MCHC: 32.2 g/dL (ref 30.0–36.0)
MCV: 104.9 fL — ABNORMAL HIGH (ref 80.0–100.0)
Platelets: 214 K/uL (ref 150–400)
RBC: 3.05 MIL/uL — ABNORMAL LOW (ref 3.87–5.11)
RDW: 12.5 % (ref 11.5–15.5)
WBC: 9.5 K/uL (ref 4.0–10.5)
nRBC: 0 % (ref 0.0–0.2)

## 2024-10-07 MED ORDER — OXYCODONE HCL 5 MG PO TABS: 2.5000 mg | ORAL_TABLET | ORAL | 0 refills | Status: AC | PRN

## 2024-10-07 MED ORDER — ENOXAPARIN SODIUM 40 MG/0.4ML IJ SOSY: 40.0000 mg | PREFILLED_SYRINGE | INTRAMUSCULAR | 0 refills | Status: AC

## 2024-10-07 NOTE — Progress Notes (Signed)
  Subjective: 1 Day Post-Op Procedure(s) (LRB): HEMIARTHROPLASTY (BIPOLAR) HIP, POSTERIOR APPROACH FOR FRACTURE (Right) Patient reports pain as moderate.   Patient is well, and has had no acute complaints or problems Plan is to go Rehab versus home after hospital stay. Negative for chest pain and shortness of breath Fever: no Gastrointestinal: Positive for nausea and vomiting during the night.  Improving.  Objective: Vital signs in last 24 hours: Temp:  [97.2 F (36.2 C)-98.5 F (36.9 C)] 97.5 F (36.4 C) (10/08 0505) Pulse Rate:  [75-99] 93 (10/08 0543) Resp:  [11-22] 14 (10/08 0505) BP: (134-224)/(57-98) 137/59 (10/08 0543) SpO2:  [95 %-100 %] 100 % (10/08 0505)  Intake/Output from previous day:  Intake/Output Summary (Last 24 hours) at 10/07/2024 0701 Last data filed at 10/07/2024 0614 Gross per 24 hour  Intake 2120 ml  Output 1150 ml  Net 970 ml    Intake/Output this shift: No intake/output data recorded.  Labs: Recent Labs    10/05/24 1405 10/06/24 0405 10/07/24 0423  HGB 12.5 11.3* 10.3*   Recent Labs    10/06/24 0405 10/07/24 0423  WBC 8.3 9.5  RBC 3.28* 3.05*  HCT 34.5* 32.0*  PLT 212 214   Recent Labs    10/06/24 0405 10/07/24 0423  NA 137 137  K 3.9 4.4  CL 101 102  CO2 27 28  BUN 21 17  CREATININE 0.68 0.57  GLUCOSE 111* 130*  CALCIUM 8.7* 8.9   No results for input(s): LABPT, INR in the last 72 hours.   EXAM General - Patient is Alert and Oriented Extremity - Neurovascular intact Sensation intact distally Dorsiflexion/Plantar flexion intact Compartment soft With the knee immobilizer in place Dressing/Incision - clean, dry, no drainage Motor Function - intact, moving foot and toes well on exam.   Past Medical History:  Diagnosis Date   Arteriovenous malformation of liver    B12 deficiency    Cervical disc disease    Depression    Hypertension    Osteoarthritis     Assessment/Plan: 1 Day Post-Op Procedure(s)  (LRB): HEMIARTHROPLASTY (BIPOLAR) HIP, POSTERIOR APPROACH FOR FRACTURE (Right) Principal Problem:   Closed displaced fracture of right femoral neck (HCC)  Estimated body mass index is 24.69 kg/m as calculated from the following:   Height as of this encounter: 5' 6 (1.676 m).   Weight as of this encounter: 69.4 kg. Advance diet Up with therapy  Discharge planning: Dressing changes needed.  No showering.  Follow-up at [facility] orthopedics for x-rays and staple removal in 2 weeks.  Posterior hip precautions  DVT Prophylaxis - Lovenox and TED hose Weight-Bearing as tolerated to right leg  Krystal Doyne, PA-C Orthopaedic Surgery 10/07/2024, 7:01 AM

## 2024-10-07 NOTE — Evaluation (Signed)
 Occupational Therapy Evaluation Patient Details Name: Loretta Ramsey MRN: [id number] DOB: 1946/09/28 Today's Date: 10/07/2024   History of Present Illness   78 y/o female s/p fall with R hip fracture and subsequent total hip (posterior approach) 10/9.     Clinical Impressions Patient presenting with decreased Ind in self care,balance, functional mobility/transfers, endurance, and safety awareness. Patient reports being Ind at baseline and living with husband. Pt with increased pain with mobility during evaluation. OT educated pt on posterior hip precautions related to self care tasks and mobility. Pt performing bed mobility with max A. Pt tolerate sitting EOB for ~ 5 minutes with SBA but unable to advance further as she becomes nauseated and states she has to lay down. Max A to return to supine. Patient will benefit from acute OT to increase overall independence in the areas of ADLs, functional mobility, and safety awareness in order to safely discharge.     If plan is discharge home, recommend the following:   A lot of help with walking and/or transfers;A lot of help with bathing/dressing/bathroom;Assistance with cooking/housework;Help with stairs or ramp for entrance;Assist for transportation     Functional Status Assessment   Patient has had a recent decline in their functional status and demonstrates the ability to make significant improvements in function in a reasonable and predictable amount of time.     Equipment Recommendations   Other (comment) (defer to next venue of care)      Precautions/Restrictions   Precautions Precautions: Posterior Hip;Fall Required Braces or Orthoses: Knee Immobilizer - Right Restrictions Weight Bearing Restrictions Per Provider Order: Yes RLE Weight Bearing Per Provider Order: Weight bearing as tolerated     Mobility Bed Mobility Overal bed mobility: Needs Assistance Bed Mobility: Supine to Sit, Sit to Supine     Supine to sit:  Max assist Sit to supine: Max assist        Transfers                   General transfer comment: sat EOB for ~ 5 minutes but then became nauseated      Balance Overall balance assessment: Needs assistance   Sitting balance-Leahy Scale: Fair                                     ADL either performed or assessed with clinical judgement   ADL Overall ADL's : Needs assistance/impaired                                       General ADL Comments: anticipate pt would need heavy max- total A for LB self care tasks. Unable to transfer this session.     Vision Patient Visual Report: No change from baseline              Pertinent Vitals/Pain Pain Assessment Pain Assessment: 0-10 Pain Score: 5  Pain Location: reports R hip pain Pain Descriptors / Indicators: Aching, Discomfort, Grimacing, Guarding Pain Intervention(s): Limited activity within patient's tolerance, Monitored during session, Premedicated before session, Repositioned     Extremity/Trunk Assessment Upper Extremity Assessment Upper Extremity Assessment: Generalized weakness   Lower Extremity Assessment Lower Extremity Assessment: Generalized weakness       Communication Communication Communication: No apparent difficulties   Cognition Arousal: Alert Behavior During Therapy: Anxious  Following commands: Intact       Cueing  General Comments   Cueing Techniques: Verbal cues  Pt eager to try and do some activity, but ultimately severe nausea stopped her from being able to fully participate           Home Living Family/patient expects to be discharged to:: Skilled nursing facility                                        Prior Functioning/Environment Prior Level of Function : Driving;Independent/Modified Independent             Mobility Comments: did driving, running errands, etc ADLs  Comments: Pt reports being Ind with self care, cooking, managed laundry, light chores, etc. She does have someone come in to do heavier cleaning.    OT Problem List: Decreased strength;Decreased safety awareness;Decreased activity tolerance;Decreased knowledge of use of DME or AE;Impaired balance (sitting and/or standing);Decreased knowledge of precautions   OT Treatment/Interventions: Self-care/ADL training;Therapeutic activities;Therapeutic exercise;Energy conservation;Patient/family education;DME and/or AE instruction;Balance training      OT Goals(Current goals can be found in the care plan section)   Acute Rehab OT Goals Patient Stated Goal: to decrease pain OT Goal Formulation: With patient/family Time For Goal Achievement: 10/21/24 Potential to Achieve Goals: Fair ADL Goals Pt Will Perform Grooming: sitting;with modified independence Pt Will Perform Lower Body Dressing: sit to/from stand;with adaptive equipment;with contact guard assist Pt Will Transfer to Toilet: with min assist;ambulating Pt Will Perform Toileting - Clothing Manipulation and hygiene: with min assist;sit to/from stand   OT Frequency:  Min 2X/week       AM-PAC OT 6 Clicks Daily Activity     Outcome Measure Help from another person eating meals?: None Help from another person taking care of personal grooming?: A Little Help from another person toileting, which includes using toliet, bedpan, or urinal?: Total Help from another person bathing (including washing, rinsing, drying)?: A Lot Help from another person to put on and taking off regular upper body clothing?: A Little Help from another person to put on and taking off regular lower body clothing?: Total 6 Click Score: 14   End of Session Nurse Communication: Mobility status  Activity Tolerance: Patient limited by pain;Other (comment) (nausea) Patient left: in bed;with call bell/phone within reach;with bed alarm set;with family/visitor present  OT  Visit Diagnosis: Unsteadiness on feet (R26.81);Repeated falls (R29.6);Muscle weakness (generalized) (M62.81)                Time: 8681-8664 OT Time Calculation (min): 17 min Charges:  OT General Charges $OT Visit: 1 Visit OT Evaluation $OT Eval Moderate Complexity: [address], [place], OTR/L , [facility] ascom [phone]  10/07/24, 2:30 PM

## 2024-10-07 NOTE — Progress Notes (Signed)
 1500 Nurse informed pt IV infiltrated. R arm slightly swollen and red. Heat applied. Pharmacy notified.

## 2024-10-07 NOTE — Progress Notes (Addendum)
 PROGRESS NOTE TONYA Ramsey  FMW:[id number] DOB: May 14, 1946 DOA: 10/05/2024 PCP: Cleotilde Oneil FALCON, MD  Chief Complaint  Patient presents with   Hip Pain   Hospital Course:  Loretta Ramsey is a 78 y.o. female with medical history significant of GERD, Depression, urinary incontinence presented with right-sided pain after she slipped and fell on the concrete porch. Admitted for right femoral neck fracture s/p mechanical fall  Subjective: Patient reports feeling improved today. She has no pain with rest but does still suffer when moving. Has not been up with PT yet at this point. Agreeable to SNF as she has no caregiver that would be able to physically help her up at home.  Objective: Vitals:   10/07/24 0106 10/07/24 0505 10/07/24 0543 10/07/24 0726  BP: (!) 160/64 (!) 177/58 (!) 137/59 (!) 122/54  Pulse: 93 92 93 94  Resp: 18 14  16   Temp: 98.3 F (36.8 C) (!) 97.5 F (36.4 C)  97.8 F (36.6 C)  TempSrc:    Oral  SpO2: 100% 100%  100%  Weight:      Height:        Intake/Output Summary (Last 24 hours) at 10/07/2024 0742 Last data filed at 10/07/2024 9385 Gross per 24 hour  Intake 2120 ml  Output 1150 ml  Net 970 ml   Filed Weights   10/05/24 1357  Weight: 69.4 kg   Examination: General: Awake, no distress.  CV:  RRR, no murmurs heard, no pedal edema Resp: clear to auscultation bilaterally MSK: Right hip and flank TTP, ROM decreased due to pain, skin abrasion R elbow Neuro: AO x 3, no gross focal deficits.  Assessment & Plan:  Right femoral neck fracture with impaction and varus angulation of the distal fragment- s/p Mechanical Fall - Seen by Orthopedics,s/p Right hip hemiarthroplasty 10/7 - Pain management, Antiemetics prn - PT/OT. Plan to go to SNF  Hypoxia- patient currently on 2Lnc without a baseline use. She denies dyspnea. Possibly atelectasis post-op or some pulmonary contusion from fall. Doubt related to incidental finding on chest imaging.  - continue to use  incentive spirometer and wean as tolerated   Elevated BP- Not on meds at home - Monitor BP    GERD - PPI   Depression - Wellbutrin, Trazodone   Incidental findings - 1.2 cm diameter ground glass nodule in RUL.  Follow-up with CT in 6 months  DVT prophylaxis: SCD   Code Status: Full Code Disposition:  TBD  Consultants:  Treatment Team:  Consulting Physician: Maryrose Cordella Charleston, MD  Procedures:  R hip hemiarthroplasty  Data Reviewed: I have personally reviewed following labs and imaging studies CBC: Recent Labs  Lab 10/05/24 1405 10/06/24 0405 10/07/24 0423  WBC 11.0* 8.3 9.5  NEUTROABS 7.7  --   --   HGB 12.5 11.3* 10.3*  HCT 38.6 34.5* 32.0*  MCV 106.9* 105.2* 104.9*  PLT 273 212 214   Basic Metabolic Panel: Recent Labs  Lab 10/05/24 1405 10/06/24 0405 10/07/24 0423  NA 136 137 137  K 4.2 3.9 4.4  CL 98 101 102  CO2 24 27 28   GLUCOSE 92 111* 130*  BUN 25* 21 17  CREATININE 0.89 0.68 0.57  CALCIUM 9.5 8.7* 8.9    LOS: 2 days  MDM: Patient is high risk for one or more organ failure.  They necessitate ongoing hospitalization for continued IV therapies and subsequent lab monitoring. Total time spent interpreting labs and vitals, reviewing the medical record, coordinating care amongst consultants  and care team members, directly assessing and discussing care with the patient and/or family: 45 min  Marien LITTIE Piety, MD [facility]  To contact the attending physician between 7A-7P please use Epic Chat. To contact the covering physician during after hours 7P-7A, please review Amion.  10/07/2024, 7:42 AM

## 2024-10-07 NOTE — Evaluation (Signed)
 Physical Therapy Evaluation Patient Details Name: Loretta Ramsey MRN: [id number] DOB: 1946/02/04 Today's Date: 10/07/2024  History of Present Illness  77 y/o female s/p fall with R hip fracture and subsequent total hip (posterior approach) 10/9.  Clinical Impression  Pt initially with good effort, attentive during precautions education and HEP.  She had expected pain and hesitancy, but did well with LE exercises in bed.  Pt, however, had nausea issues with sitting EOB and though she showed good effort in trying to do some mobility she ultimately felt too poorly and needed to abort.  Pt showed good attitude and willingness to try standing/ambulation again this afternoon.  Pt will benefit from continued PT to address functional limitations.        If plan is discharge home, recommend the following: A lot of help with walking and/or transfers;A lot of help with bathing/dressing/bathroom;Assistance with cooking/housework;Assistance with feeding;Assist for transportation   Can travel by private vehicle   No    Equipment Recommendations  (TBD at next venue)  Recommendations for Other Services       Functional Status Assessment Patient has had a recent decline in their functional status and demonstrates the ability to make significant improvements in function in a reasonable and predictable amount of time.     Precautions / Restrictions Precautions Precautions: Posterior Hip;Fall Required Braces or Orthoses: Knee Immobilizer - Right (when not working with PT) Restrictions RLE Weight Bearing Per Provider Order: Weight bearing as tolerated      Mobility  Bed Mobility Overal bed mobility: Needs Assistance Bed Mobility: Supine to Sit, Sit to Supine     Supine to sit: Mod assist Sit to supine: Max assist   General bed mobility comments: Pt showed good effort but ultimately was quite limited needing considerable assist.  Pt had significant nausea (denies light-headedness/syncope)  getting to EOB and aborted further mobility.    Transfers                   General transfer comment: pt willing to try initially, but nausea was too severe, deferred standing effort this AM    Ambulation/Gait                  Stairs            Wheelchair Mobility     Tilt Bed    Modified Rankin (Stroke Patients Only)       Balance Overall balance assessment: Needs assistance     Sitting balance - Comments: breifly maintained sitting EOB, ultimately leaned over onto bed and would/could not get back up                                     Pertinent Vitals/Pain Pain Assessment Pain Assessment: 0-10 Pain Score: 3  Pain Location: reports R hip pain not too bad on R, increases with activity    Home Living Family/patient expects to be discharged to:: Skilled nursing facility                        Prior Function Prior Level of Function : Driving;Independent/Modified Independent             Mobility Comments: did driving, running errands, etc ADLs Comments: managed laundry, light chores, etc     Extremity/Trunk Assessment   Upper Extremity Assessment Upper Extremity Assessment: Generalized weakness    Lower Extremity  Assessment Lower Extremity Assessment: Generalized weakness (expected post-op weakness on R)       Communication   Communication Communication: No apparent difficulties    Cognition Arousal: Alert Behavior During Therapy: Anxious   PT - Cognitive impairments: No apparent impairments                         Following commands: Intact       Cueing       General Comments General comments (skin integrity, edema, etc.): Pt eager to try and do some activity, but ultimately severe nausea stopped her from being able to fully participate    Exercises Total Joint Exercises Ankle Circles/Pumps: AROM, 15 reps Quad Sets: Strengthening, 10 reps Gluteal Sets: AROM, 10 reps Hip  ABduction/ADduction: AROM, Strengthening, 5 reps Straight Leg Raises: AROM, 5 reps, AAROM   Assessment/Plan    PT Assessment Patient needs continued PT services  PT Problem List Decreased strength;Decreased range of motion;Decreased activity tolerance;Decreased balance;Decreased safety awareness;Pain;Decreased knowledge of precautions       PT Treatment Interventions Gait training;DME instruction;Functional mobility training;Therapeutic activities;Therapeutic exercise;Balance training;Neuromuscular re-education;Patient/family education    PT Goals (Current goals can be found in the Care Plan section)  Acute Rehab PT Goals Patient Stated Goal: get stronger PT Goal Formulation: With patient Time For Goal Achievement: 10/20/24 Potential to Achieve Goals: Fair    Frequency BID     Co-evaluation               AM-PAC PT 6 Clicks Mobility  Outcome Measure Help needed turning from your back to your side while in a flat bed without using bedrails?: A Lot Help needed moving from lying on your back to sitting on the side of a flat bed without using bedrails?: A Lot Help needed moving to and from a bed to a chair (including a wheelchair)?: Total Help needed standing up from a chair using your arms (e.g., wheelchair or bedside chair)?: Total Help needed to walk in hospital room?: Total Help needed climbing 3-5 steps with a railing? : Total 6 Click Score: 8    End of Session Equipment Utilized During Treatment: Gait belt;Right knee immobilizer Activity Tolerance:  (limited due to nausea) Patient left: with bed alarm set;with call bell/phone within reach Nurse Communication: Mobility status PT Visit Diagnosis: Muscle weakness (generalized) (M62.81);Difficulty in walking, not elsewhere classified (R26.2);Pain Pain - Right/Left: Right Pain - part of body: Hip    Time: 8961-8888 PT Time Calculation (min) (ACUTE ONLY): 33 min   Charges:   PT Evaluation $PT Eval Low Complexity:  1 Low PT Treatments $Therapeutic Exercise: 8-22 mins $Therapeutic Activity: 8-22 mins PT General Charges $$ ACUTE PT VISIT: 1 Visit         Carmin JONELLE Deed, DPT 10/07/2024, 1:18 PM

## 2024-10-07 NOTE — Discharge Instructions (Signed)
 POSTERIOR TOTAL HIP REPLACEMENT POSTOPERATIVE DIRECTIONS  Hip Rehabilitation, Guidelines Following Surgery  The results of a hip operation are greatly improved after range of motion and muscle strengthening exercises. Follow all safety measures which are given to protect your hip. If any of these exercises cause increased pain or swelling in your joint, decrease the amount until you are comfortable again. Then slowly increase the exercises. Call your caregiver if you have problems or questions.   HOME CARE INSTRUCTIONS  Remove items at home which could result in a fall. This includes throw rugs or furniture in walking pathways.  ICE to the affected hip every three hours for 30 minutes at a time and then as needed for pain and swelling.  Continue to use ice on the hip for pain and swelling from surgery. You may notice swelling that will progress down to the foot and ankle.  This is normal after surgery.  Elevate the leg when you are not up walking on it.   Continue to use the breathing machine which will help keep your temperature down.  It is common for your temperature to cycle up and down following surgery, especially at night when you are not up moving around and exerting yourself.  The breathing machine keeps your lungs expanded and your temperature down.  DIET You may resume your previous home diet once your are discharged from the hospital.  DRESSING / WOUND CARE / SHOWERING You may change your dressing 3-5 days after surgery.  Then change the dressing every day with sterile gauze.  Please use good hand washing techniques before changing the dressing.  Do not use any lotions or creams on the incision until instructed by your surgeon. You need to keep your dressing dry after discharge.   Change the surgical dressing if needed with Physical Therapy and reapply a dry dressing each time.  Knee immobilizer on at all times when in the bed.  Able to remove for ambulation training with physical  therapy.  Posterior hip precautions.  ACTIVITY Walk with your walker as instructed. Use walker as long as suggested by your caregivers. Avoid periods of inactivity such as sitting longer than an hour when not asleep. This helps prevent blood clots.  You may resume a sexual relationship in one month or when given the OK by your doctor.  You may return to work once you are cleared by your doctor.  Do not drive a car for 6 weeks or until released by you surgeon.  Do not drive while taking narcotics.  WEIGHT BEARING Weight bearing as tolerated with assist device (walker, cane, etc) as directed, use it as long as suggested by your surgeon or therapist, typically at least 4-6 weeks.  POSTOPERATIVE CONSTIPATION PROTOCOL Constipation - defined medically as fewer than three stools per week and severe constipation as less than one stool per week.  One of the most common issues patients have following surgery is constipation.  Even if you have a regular bowel pattern at home, your normal regimen is likely to be disrupted due to multiple reasons following surgery.  Combination of anesthesia, postoperative narcotics, change in appetite and fluid intake all can affect your bowels.  In order to avoid complications following surgery, here are some recommendations in order to help you during your recovery period.  Colace (docusate) - Pick up an over-the-counter form of Colace or another stool softener and take twice a day as long as you are requiring postoperative pain medications.  Take with a full  glass of water daily.  If you experience loose stools or diarrhea, hold the colace until you stool forms back up.  If your symptoms do not get better within 1 week or if they get worse, check with your doctor.  Dulcolax (bisacodyl) - Pick up over-the-counter and take as directed by the product packaging as needed to assist with the movement of your bowels.  Take with a full glass of water.  Use this product as needed  if not relieved by Colace only.   MiraLax (polyethylene glycol) - Pick up over-the-counter to have on hand.  MiraLax is a solution that will increase the amount of water in your bowels to assist with bowel movements.  Take as directed and can mix with a glass of water, juice, soda, coffee, or tea.  Take if you go more than two days without a movement. Do not use MiraLax more than once per day. Call your doctor if you are still constipated or irregular after using this medication for 7 days in a row.  If you continue to have problems with postoperative constipation, please contact the office for further assistance and recommendations.  If you experience the worst abdominal pain ever or develop nausea or vomiting, please contact the office immediatly for further recommendations for treatment.  ITCHING  If you experience itching with your medications, try taking only a single pain pill, or even half a pain pill at a time.  You can also use Benadryl over the counter for itching or also to help with sleep.   TED HOSE STOCKINGS Wear the elastic stockings on both legs for three weeks following surgery during the day but you may remove then at night for sleeping.  MEDICATIONS See your medication summary on the "After Visit Summary" that the nursing staff will review with you prior to discharge.  You may have some home medications which will be placed on hold until you complete the course of blood thinner medication.  It is important for you to complete the blood thinner medication as prescribed by your surgeon.  Continue your approved medications as instructed at time of discharge.  PRECAUTIONS If you experience chest pain or shortness of breath - call 911 immediately for transfer to the hospital emergency department.  If you develop a fever greater that 101 F, purulent drainage from wound, increased redness or drainage from wound, foul odor from the wound/dressing, or calf pain - CONTACT YOUR SURGEON.                                                    FOLLOW-UP APPOINTMENTS Make sure you keep all of your appointments after your operation with your surgeon and caregivers. You should call the office at the above phone number and make an appointment for approximately two weeks after the date of your surgery or on the date instructed by your surgeon outlined in the After Visit Summary.  RANGE OF MOTION AND STRENGTHENING EXERCISES  These exercises are designed to help you keep full movement of your hip joint. Follow your caregiver's or physical therapist's instructions. Perform all exercises about fifteen times, three times per day or as directed. Exercise both hips, even if you have had only one joint replacement. These exercises can be done on a training (exercise) mat, on the floor, on a table or on a bed.  Use whatever works the best and is most comfortable for you. Use music or television while you are exercising so that the exercises are a pleasant break in your day. This will make your life better with the exercises acting as a break in routine you can look forward to.  Lying on your back, slowly slide your foot toward your buttocks, raising your knee up off the floor. Then slowly slide your foot back down until your leg is straight again.  Lying on your back spread your legs as far apart as you can without causing discomfort.  Lying on your side, raise your upper leg and foot straight up from the floor as far as is comfortable. Slowly lower the leg and repeat.  Lying on your back, tighten up the muscle in the front of your thigh (quadriceps muscles). You can do this by keeping your leg straight and trying to raise your heel off the floor. This helps strengthen the largest muscle supporting your knee.  Lying on your back, tighten up the muscles of your buttocks both with the legs straight and with the knee bent at a comfortable angle while keeping your heel on the floor.      IF YOU ARE  TRANSFERRED TO A SKILLED REHAB FACILITY If the patient is transferred to a skilled rehab facility following release from the hospital, a list of the current medications will be sent to the facility for the patient to continue.  When discharged from the skilled rehab facility, please have the facility set up the patient's Home Health Physical Therapy prior to being released. Also, the skilled facility will be responsible for providing the patient with their medications at time of release from the facility to include their pain medication, the muscle relaxants, and their blood thinner medication. If the patient is still at the rehab facility at time of the two week follow up appointment, the skilled rehab facility will also need to assist the patient in arranging follow up appointment in our office and any transportation needs.  MAKE SURE YOU:  Understand these instructions.  Get help right away if you are not doing well or get worse.    Pick up stool softner and laxative for home use following surgery while on pain medications. Do not submerge incision under water. Please use good hand washing techniques while changing dressing each day. May shower starting three days after surgery. Please use a clean towel to pat the incision dry following showers. Continue to use ice for pain and swelling after surgery. Do not use any lotions or creams on the incision until instructed by your surgeon.

## 2024-10-07 NOTE — Progress Notes (Signed)
 Physical Therapy Treatment Patient Details Name: Loretta Ramsey MRN: [id number] DOB: 04/07/1946 Today's Date: 10/07/2024   History of Present Illness 78 y/o female s/p fall with R hip fracture and subsequent total hip (posterior approach) 10/9.    PT Comments  Pt continues to be pensive about doing a lot, but did show good effort with plenty of encouragement and cuing.  She again was able to do some scooting toward EOB/bridging but needed assist to get to edge and then up upright.  Pt tolerated sitting better and was eager to try standing.  We did 2 bouts of standing with small shuffling/turning Steps to/from the recliner/bed.  Pt struggled to keep weight forward over walker, direct assist to keep hips forward t/o the entirety of transfer efforts.  Pt did control descents to seated with heavy cuing for set up, UE use and positioning.  Review of HEP for overnight, pt too tired post transfer effort to offer much regarding exercises.  SpO2 did remain in the high 90s on 1L t/o the session.  Pt will benefit from ongoing PT, continue with POC per [facility] protocols.    If plan is discharge home, recommend the following: A lot of help with walking and/or transfers;A lot of help with bathing/dressing/bathroom;Assistance with cooking/housework;Assistance with feeding;Assist for transportation   Can travel by private vehicle     No  Equipment Recommendations   (TBD at rehab)    Recommendations for Other Services       Precautions / Restrictions Precautions Precautions: Posterior Hip;Fall Required Braces or Orthoses: Knee Immobilizer - Right Knee Immobilizer - Right:  (expect when working with PT) Restrictions RLE Weight Bearing Per Provider Order: Weight bearing as tolerated     Mobility  Bed Mobility Overal bed mobility: Needs Assistance Bed Mobility: Sit to Supine, Supine to Sit     Supine to sit: Mod assist Sit to supine: Max assist   General bed mobility comments: able to bridge and  scoot, but still needing heavy assist for transitions    Transfers Overall transfer level: Needs assistance Equipment used: Rolling walker (2 wheels) Transfers: Sit to/from Stand Sit to Stand: Mod assist           General transfer comment: Pt with only minimal nausea/dizziness in sitting.  Heavy cuing for set up and UE use, pt needing a lot of assist to shift hips forward and get weight into the walker but did stand X2 this session with need for constant assist    Ambulation/Gait               General Gait Details: no true ambulation, able to manage heavily assist small steps/weight shift to recliner and later back to bed with heavy UE use on walker and constant cuing   Stairs             Wheelchair Mobility     Tilt Bed    Modified Rankin (Stroke Patients Only)       Balance Overall balance assessment: Needs assistance   Sitting balance-Leahy Scale: Fair Sitting balance - Comments: breifly maintained sitting EOB, ultimately leaned over onto bed and would/could not get back up   Standing balance support: Bilateral upper extremity supported Standing balance-Leahy Scale: Poor Standing balance comment: struggled to effectively use UEs on walker and maintain hips forward posturing                            Communication Communication Communication: No  apparent difficulties  Cognition Arousal: Alert Behavior During Therapy: Anxious   PT - Cognitive impairments: No apparent impairments                         Following commands: Intact      Cueing Cueing Techniques: Verbal cues, Visual cues, Tactile cues  Exercises Total Joint Exercises Ankle Circles/Pumps: AROM, 15 reps Quad Sets: Strengthening, 10 reps Gluteal Sets: AROM, 10 reps Hip ABduction/ADduction: AROM, Strengthening, 5 reps Straight Leg Raises: AROM, 5 reps, AAROM    General Comments General comments (skin integrity, edema, etc.): Pt was able to tolerate sitting  without dry heaving, great effort with standing X2, but does remain functionally very limited      Pertinent Vitals/Pain Pain Assessment Pain Assessment: 0-10 Pain Score: 5  Pain Location: pain meds prior to session    Home Living Family/patient expects to be discharged to:: Skilled nursing facility                        Prior Function            PT Goals (current goals can now be found in the care plan section) Acute Rehab PT Goals Patient Stated Goal: get stronger PT Goal Formulation: With patient Time For Goal Achievement: 10/20/24 Potential to Achieve Goals: Fair Progress towards PT goals: Progressing toward goals    Frequency    BID      PT Plan      Co-evaluation              AM-PAC PT 6 Clicks Mobility   Outcome Measure  Help needed turning from your back to your side while in a flat bed without using bedrails?: A Lot Help needed moving from lying on your back to sitting on the side of a flat bed without using bedrails?: A Lot Help needed moving to and from a bed to a chair (including a wheelchair)?: A Lot Help needed standing up from a chair using your arms (e.g., wheelchair or bedside chair)?: A Lot Help needed to walk in hospital room?: Total Help needed climbing 3-5 steps with a railing? : Total 6 Click Score: 10    End of Session Equipment Utilized During Treatment: Gait belt;Right knee immobilizer Activity Tolerance: Patient limited by fatigue;Patient limited by pain Patient left: with bed alarm set;with call bell/phone within reach Nurse Communication: Mobility status PT Visit Diagnosis: Muscle weakness (generalized) (M62.81);Difficulty in walking, not elsewhere classified (R26.2);Pain Pain - Right/Left: Right Pain - part of body: Hip     Time: 8392-8367 PT Time Calculation (min) (ACUTE ONLY): 25 min  Charges:    $Therapeutic Exercise: 8-22 mins $Therapeutic Activity: 23-37 mins PT General Charges $$ ACUTE PT VISIT: 1  Visit                     Carmin JONELLE Deed, DPT 10/07/2024, 4:56 PM

## 2024-10-07 NOTE — Anesthesia Postprocedure Evaluation (Signed)
 Anesthesia Post Note  Patient: Loretta Ramsey  Procedure(s) Performed: HEMIARTHROPLASTY (BIPOLAR) HIP, POSTERIOR APPROACH FOR FRACTURE (Right: Hip)  Patient location during evaluation: PACU Anesthesia Type: General Level of consciousness: awake and alert Pain management: pain level controlled Vital Signs Assessment: post-procedure vital signs reviewed and stable Respiratory status: spontaneous breathing, nonlabored ventilation and respiratory function stable Cardiovascular status: blood pressure returned to baseline and stable Postop Assessment: no apparent nausea or vomiting Anesthetic complications: no   No notable events documented.   Last Vitals:  Vitals:   10/07/24 0505 10/07/24 0543  BP: (!) 177/58 (!) 137/59  Pulse: 92 93  Resp: 14   Temp: (!) 36.4 C   SpO2: 100%     Last Pain:  Vitals:   10/07/24 0632  TempSrc:   PainSc: 5                  Fairy POUR Reo Portela

## 2024-10-08 DIAGNOSIS — S72001A Fracture of unspecified part of neck of right femur, initial encounter for closed fracture: Secondary | ICD-10-CM | POA: Diagnosis not present

## 2024-10-08 MED ORDER — LACTULOSE 10 GM/15ML PO SOLN
20.0000 g | Freq: Every day | ORAL | Status: DC
  Administered 2024-10-08 – 2024-10-09 (×2): 20 g via ORAL
  Filled 2024-10-08 (×2): qty 30

## 2024-10-08 NOTE — TOC Progression Note (Signed)
 Transition of Care [facility]) - Progression Note    Patient Details  Name: Loretta Ramsey MRN: [id number] Date of Birth: 02-18-1946  Transition of Care [facility]) CM/SW Contact  Marinda Cooks, RN Phone Number: 10/08/2024, 2:14 PM  Clinical Narrative:    This CM spoke with pt who was accompanied by her (Daughter) Anette Collier who informed she was visiting from [facility]. This CM introduced role and discussed dc plan recommendation per PT . Pt agreed at this time with recommendation for SNF/Rehab and informed she did not have a preference and would like to review bed offers when they are available. Pt also asked that her daughter be informed of bed offers to assist her with process . FL2 completed bed  referrals sent awaiting bed offers to provide pt and her daughter for choice . TOC will cont to follow dc planning / care coordination and update as applicable.                      Expected Discharge Plan and Services                                               Social Drivers of Health (SDOH) Interventions SDOH Screenings   Food Insecurity: Patient Declined (10/06/2024)  Housing: Patient Declined (10/06/2024)  Transportation Needs: No Transportation Needs (10/07/2024)  Utilities: Patient Declined (10/06/2024)  Financial Resource Strain: Low Risk  (09/28/2024)   Received from [facility]  Social Connections: Unknown (10/06/2024)  Tobacco Use: Medium Risk (10/06/2024)    Readmission Risk Interventions     No data to display

## 2024-10-08 NOTE — Care Management Important Message (Signed)
 Important Message  Patient Details  Name: COURTNAY PETRILLA MRN: [id number] Date of Birth: [birth year]   Important Message Given:  Yes - Medicare IM     Ariela Mochizuki W, CMA 10/08/2024, 11:42 AM

## 2024-10-08 NOTE — Progress Notes (Signed)
 Physical Therapy Treatment Patient Details Name: Loretta Ramsey MRN: [id number] DOB: [birth year] Today's Date: 10/08/2024   History of Present Illness 78 y/o female s/p fall with R hip fracture and subsequent total hip (posterior approach) 10/9.    PT Comments  Pt with less pain/improved tolerance today and was able to perform exercises with more strength and quality of motion.  She continues to struggle with any prolonged standing/WBing/ambulation attempts 2/2 U&LE weakness. Pt put forth good effort, will benefit from ongoing PT to address functional limitations, continue with POC.     If plan is discharge home, recommend the following: A lot of help with walking and/or transfers;A lot of help with bathing/dressing/bathroom;Assistance with cooking/housework;Assistance with feeding;Assist for transportation   Can travel by private vehicle     No  Equipment Recommendations   (TBD at next venue)    Recommendations for Other Services       Precautions / Restrictions Precautions Precautions: Posterior Hip;Fall Precaution Booklet Issued: Yes (comment) (+ HEP) Required Braces or Orthoses: Knee Immobilizer - Right Restrictions Weight Bearing Restrictions Per Provider Order: Yes RLE Weight Bearing Per Provider Order: Weight bearing as tolerated     Mobility  Bed Mobility Overal bed mobility: Needs Assistance Bed Mobility: Supine to Sit     Supine to sit: Contact guard, Min assist     General bed mobility comments: Much improved effort getting toward EOB, plenty of cuing and light LE assist but nearly able to attain sitting w/o assist    Transfers Overall transfer level: Needs assistance Equipment used: Rolling walker (2 wheels) Transfers: Sit to/from Stand Sit to Stand: Min assist           General transfer comment: Continued need for set-up and sequencing cuing but better able to use L side (U&LEs) to assist with lifting to standing    Ambulation/Gait Ambulation/Gait  assistance: Contact guard assist Gait Distance (Feet): 4 Feet Assistive device: Rolling walker (2 wheels)         General Gait Details: Pt showed great effort with ambulation this date, attempted t/o KI as QS and leg ext were better today, however despite cuing she had knee buckling and general hesitancy with WBing on the R.  Would benefit from continued KI use during [employer] tasks   Stairs             Wheelchair Mobility     Tilt Bed    Modified Rankin (Stroke Patients Only)       Balance Overall balance assessment: Needs assistance Sitting-balance support: Bilateral upper extremity supported Sitting balance-Leahy Scale: Good     Standing balance support: Bilateral upper extremity supported Standing balance-Leahy Scale: Poor Standing balance comment: struggled to effectively use UEs on walker and maintain hips forward posturing                            Communication Communication Communication: No apparent difficulties  Cognition Arousal: Alert Behavior During Therapy: WFL for tasks assessed/performed, Anxious   PT - Cognitive impairments: No apparent impairments                         Following commands: Intact      Cueing Cueing Techniques: Verbal cues, Visual cues, Tactile cues  Exercises Total Joint Exercises Ankle Circles/Pumps: AROM, 15 reps Quad Sets: Strengthening, 10 reps Gluteal Sets: AROM, 10 reps Heel Slides: AROM, 10 reps (with resisted leg ext) Hip ABduction/ADduction: AROM,  Strengthening, 5 reps Straight Leg Raises: AROM, 5 reps, AAROM    General Comments General comments (skin integrity, edema, etc.): Pt with improved overall effort with exercises, mobility and WBing, still quite weak and limited      Pertinent Vitals/Pain Pain Assessment Pain Assessment: 0-10 Pain Score: 4     Home Living                          Prior Function            PT Goals (current goals can now be found in the care  plan section) Progress towards PT goals: Progressing toward goals    Frequency    BID      PT Plan      Co-evaluation              AM-PAC PT 6 Clicks Mobility   Outcome Measure  Help needed turning from your back to your side while in a flat bed without using bedrails?: A Little Help needed moving from lying on your back to sitting on the side of a flat bed without using bedrails?: A Little Help needed moving to and from a bed to a chair (including a wheelchair)?: A Little Help needed standing up from a chair using your arms (e.g., wheelchair or bedside chair)?: A Lot Help needed to walk in hospital room?: A Lot Help needed climbing 3-5 steps with a railing? : Total 6 Click Score: 14    End of Session Equipment Utilized During Treatment: Gait belt;Right knee immobilizer Activity Tolerance: Patient limited by fatigue;Patient limited by pain Patient left: with bed alarm set;with call bell/phone within reach Nurse Communication: Mobility status PT Visit Diagnosis: Muscle weakness (generalized) (M62.81);Difficulty in walking, not elsewhere classified (R26.2);Pain Pain - Right/Left: Right Pain - part of body: Hip     Time: 0929-1002 PT Time Calculation (min) (ACUTE ONLY): 33 min  Charges:    $Therapeutic Exercise: 8-22 mins $Therapeutic Activity: 8-22 mins PT General Charges $$ ACUTE PT VISIT: 1 Visit                     Carmin JONELLE Deed, DPT 10/08/2024, 11:08 AM

## 2024-10-08 NOTE — Progress Notes (Signed)
 PROGRESS NOTE Loretta Ramsey  FMW:[id number] DOB: 1946-10-27 DOA: 10/05/2024 PCP: Cleotilde Oneil FALCON, MD  Chief Complaint  Patient presents with   Hip Pain   Hospital Course:  Loretta Ramsey is a 78 y.o. female with medical history significant of GERD, Depression, urinary incontinence presented with right-sided pain after she slipped and fell on the concrete porch. Admitted for right femoral neck fracture s/p mechanical fall  Currently post-op and medically stable for dc to SNF when available.   Subjective: Patient reports feeling improved today. She just worked with PT and feels that she did better than the day before. Denies pain currently. Biggest complaint is that her legs still feel weak.   Objective: Vitals:   10/07/24 1400 10/07/24 1549 10/07/24 1952 10/08/24 0416  BP: (!) 118/51 (!) 145/55 (!) 138/53 (!) 147/59  Pulse: 93 88 96 97  Resp: 18 17 19 17   Temp: 98.3 F (36.8 C) 99 F (37.2 C) 98.4 F (36.9 C) 98.5 F (36.9 C)  TempSrc: Oral  Oral   SpO2: 100% 100% 100% 92%  Weight:      Height:        Intake/Output Summary (Last 24 hours) at 10/08/2024 9277 Last data filed at 10/08/2024 0600 Gross per 24 hour  Intake 573.76 ml  Output 500 ml  Net 73.76 ml   Filed Weights   10/05/24 1357  Weight: 69.4 kg   Examination: General: Awake, no distress.  CV:  RRR, no murmurs heard, no pedal edema Resp: clear to auscultation bilaterally MSK: Right hip and flank TTP, ROM decreased due to pain, skin abrasion R elbow Neuro: AO x 3, no gross focal deficits.  Assessment & Plan:  Right femoral neck fracture with impaction and varus angulation of the distal fragment- s/p Mechanical Fall - Seen by Orthopedics,s/p Right hip hemiarthroplasty 10/7 - Pain management, Antiemetics prn - PT/OT. Plan to go to SNF  Hypoxia- patient was on 2Lnc without a baseline use. She denies dyspnea. Possibly atelectasis post-op or some pulmonary contusion from fall. Doubt related to incidental  finding on chest imaging. She has now been able to wean to room air without dyspnea.  - continue to use incentive spirometer   Elevated BP- Not on meds at home. Low diastolics.  - Monitor BP    GERD - PPI   Depression - Wellbutrin, Trazodone   Incidental findings - 1.2 cm diameter ground glass nodule in RUL.  Follow-up with CT in 6 months  DVT prophylaxis: SCD   Code Status: Full Code Disposition:  TBD  Consultants:  Treatment Team:  Consulting Physician: Maryrose Cordella Charleston, MD  Procedures:  R hip hemiarthroplasty  Data Reviewed: I have personally reviewed following labs and imaging studies CBC: Recent Labs  Lab 10/05/24 1405 10/06/24 0405 10/07/24 0423  WBC 11.0* 8.3 9.5  NEUTROABS 7.7  --   --   HGB 12.5 11.3* 10.3*  HCT 38.6 34.5* 32.0*  MCV 106.9* 105.2* 104.9*  PLT 273 212 214   Basic Metabolic Panel: Recent Labs  Lab 10/05/24 1405 10/06/24 0405 10/07/24 0423  NA 136 137 137  K 4.2 3.9 4.4  CL 98 101 102  CO2 24 27 28   GLUCOSE 92 111* 130*  BUN 25* 21 17  CREATININE 0.89 0.68 0.57  CALCIUM 9.5 8.7* 8.9    LOS: 3 days  MDM: Patient is high risk for one or more organ failure.  They necessitate ongoing hospitalization for continued IV therapies and subsequent lab monitoring. Total time  spent interpreting labs and vitals, reviewing the medical record, coordinating care amongst consultants and care team members, directly assessing and discussing care with the patient and/or family: 35 min  Loretta LITTIE Piety, MD [facility]  To contact the attending physician between 7A-7P please use Epic Chat. To contact the covering physician during after hours 7P-7A, please review Amion.  10/08/2024, 7:22 AM

## 2024-10-08 NOTE — Plan of Care (Signed)
   Problem: Education: Goal: Knowledge of General Education information will improve Description Including pain rating scale, medication(s)/side effects and non-pharmacologic comfort measures Outcome: Progressing

## 2024-10-08 NOTE — Progress Notes (Signed)
  Subjective: 2 Days Post-Op Procedure(s) (LRB): HEMIARTHROPLASTY (BIPOLAR) HIP, POSTERIOR APPROACH FOR FRACTURE (Right) Patient reports pain mild in bed this morning, reports the hip is sore.  Was sleeping well when entering the room. Daughter at bedside.  Patient is well, and has had no acute complaints or problems Plan is to go Rehab versus home after hospital stay.  Care management to assist with d/c planning. Negative for chest pain and shortness of breath Fever: no Gastrointestinal: Positive for nausea and vomiting during the night.  Improving.  Denies any currently.  Objective: Vital signs in last 24 hours: Temp:  [97.8 F (36.6 C)-99 F (37.2 C)] 98.5 F (36.9 C) (10/09 0416) Pulse Rate:  [88-97] 97 (10/09 0416) Resp:  [16-19] 17 (10/09 0416) BP: (118-147)/(51-59) 147/59 (10/09 0416) SpO2:  [92 %-100 %] 92 % (10/09 0416)  Intake/Output from previous day:  Intake/Output Summary (Last 24 hours) at 10/08/2024 0646 Last data filed at 10/08/2024 0600 Gross per 24 hour  Intake 573.76 ml  Output 500 ml  Net 73.76 ml    Intake/Output this shift: Total I/O In: 240 [P.O.:240] Out: 500 [Urine:500]  Labs: Recent Labs    10/05/24 1405 10/06/24 0405 10/07/24 0423  HGB 12.5 11.3* 10.3*   Recent Labs    10/06/24 0405 10/07/24 0423  WBC 8.3 9.5  RBC 3.28* 3.05*  HCT 34.5* 32.0*  PLT 212 214   Recent Labs    10/06/24 0405 10/07/24 0423  NA 137 137  K 3.9 4.4  CL 101 102  CO2 27 28  BUN 21 17  CREATININE 0.68 0.57  GLUCOSE 111* 130*  CALCIUM 8.7* 8.9   No results for input(s): LABPT, INR in the last 72 hours.   EXAM General - Patient is Alert and Oriented Extremity - Neurovascular intact Sensation intact distally Dorsiflexion/Plantar flexion intact Compartment soft With the knee immobilizer in place Dressing/Incision - clean, dry, no drainage Motor Function - intact, moving foot and toes well on exam.   Past Medical History:  Diagnosis Date    Arteriovenous malformation of liver    B12 deficiency    Cervical disc disease    Depression    Hypertension    Osteoarthritis     Assessment/Plan: 2 Days Post-Op Procedure(s) (LRB): HEMIARTHROPLASTY (BIPOLAR) HIP, POSTERIOR APPROACH FOR FRACTURE (Right) Principal Problem:   Closed displaced fracture of right femoral neck (HCC)  Estimated body mass index is 24.69 kg/m as calculated from the following:   Height as of this encounter: 5' 6 (1.676 m).   Weight as of this encounter: 69.4 kg. Advance diet Up with therapy  Labs and vitals reviewed, WBC 9.5, Hg 10.3. Continue to work on a BM. Lactulose added. Continue with PT, current plan is probable SNF.  Discharge planning: Dressing changes needed.  No showering.  Follow-up at [facility] clinic orthopedics for x-rays and staple removal in 2 weeks.  Posterior hip precautions  DVT Prophylaxis - Lovenox and TED hose Weight-Bearing as tolerated to right leg  J. Gustavo Level, PA-C, CAQ-[facility] 10/08/2024, 6:46 AM

## 2024-10-08 NOTE — Progress Notes (Signed)
 Physical Therapy Treatment Patient Details Name: Loretta Ramsey MRN: [id number] DOB: February 14, 1946 Today's Date: 10/08/2024   History of Present Illness 78 y/o female s/p fall with R hip fracture and subsequent total hip (posterior approach) 10/9.    PT Comments  Pt continues to show good effort and attitude, but remains very limited with standing/gait tolerance and ability.  She showed great effort with exercises in bed and has shown increased tolerance but overall remains weak.  KI donned during standing/weight shifting efforts but pt still heavily reliant on UEs/walker but UE weakness and hesitancy necessitated considerable PT assist t/o the transition and few steps from recliner to bed.  Pt making slow but steady gains, will benefit from ongoing PT.  Continue with POC per total hip protocols.     If plan is discharge home, recommend the following: A lot of help with walking and/or transfers;A lot of help with bathing/dressing/bathroom;Assistance with cooking/housework;Assistance with feeding;Assist for transportation   Can travel by private vehicle     No  Equipment Recommendations   (TBD at next venue)    Recommendations for Other Services       Precautions / Restrictions Precautions Precautions: Posterior Hip;Fall Precaution Booklet Issued: Yes (comment) Precaution/Restrictions Comments: pt still needing reminders to recall all 3/3 precuations Required Braces or Orthoses: Knee Immobilizer - Right Knee Immobilizer - Right:  (except when working with rehab) Restrictions Weight Bearing Restrictions Per Provider Order: Yes RLE Weight Bearing Per Provider Order: Weight bearing as tolerated     Mobility  Bed Mobility Overal bed mobility: Needs Assistance Bed Mobility: Sit to Supine     Supine to sit: Contact guard, Min assist Sit to supine: Mod assist, Max assist   General bed mobility comments: Pt still unable to get LEs up into bed to transition back to supine     Transfers Overall transfer level: Needs assistance Equipment used: Rolling walker (2 wheels) Transfers: Sit to/from Stand Sit to Stand: Min assist, Mod assist           General transfer comment: Pt showing great effort after much cuing for set up and appropriate sequencing.  Ultimately still leaning hips back and needing considerable assist to attain upright    Ambulation/Gait Ambulation/Gait assistance: Min assist, Mod assist Gait Distance (Feet): 5 Feet Assistive device: Rolling walker (2 wheels)         General Gait Details: Pt continues to be limited with tolerance but showed great effort.  KI donned, but pt still not trusting R LE much, constant assist to keep hips from going back/sitting down.  Pt able to take more and bigger (though still struggling to clear feet) steps with afternoon but weakness and then nausea became a big issues necessitating abrupt return to sitting   Stairs             Wheelchair Mobility     Tilt Bed    Modified Rankin (Stroke Patients Only)       Balance Overall balance assessment: Needs assistance Sitting-balance support: Bilateral upper extremity supported Sitting balance-Leahy Scale: Good     Standing balance support: Bilateral upper extremity supported, Single extremity supported Standing balance-Leahy Scale: Poor Standing balance comment: struggled to effectively use UEs on walker and maintain hips forward posturing                            Communication Communication Communication: No apparent difficulties  Cognition Arousal: Alert Behavior During Therapy: [facility] for  tasks assessed/performed, Anxious   PT - Cognitive impairments: No apparent impairments                         Following commands: Intact      Cueing Cueing Techniques: Verbal cues, Visual cues, Tactile cues  Exercises Total Joint Exercises Ankle Circles/Pumps: AROM, 15 reps Quad Sets: Strengthening, 10 reps Gluteal Sets:  AROM, 10 reps Short Arc Quad: Strengthening, 10 reps Heel Slides: AROM, 10 reps (resisted leg ext) Hip ABduction/ADduction: Strengthening, 5 reps Straight Leg Raises: AROM, 5 reps, AAROM    General Comments General comments (skin integrity, edema, etc.): Pt continues to show great effort and participation, but nausea, weakness and general weakness all limit her functional capacity      Pertinent Vitals/Pain Pain Assessment Pain Assessment: 0-10 Pain Score: 2  Pain Location: R hip, increased with all movement    Home Living                          Prior Function            PT Goals (current goals can now be found in the care plan section) Progress towards PT goals: Progressing toward goals    Frequency    BID      PT Plan      Co-evaluation              AM-PAC PT 6 Clicks Mobility   Outcome Measure  Help needed turning from your back to your side while in a flat bed without using bedrails?: A Little Help needed moving from lying on your back to sitting on the side of a flat bed without using bedrails?: A Little Help needed moving to and from a bed to a chair (including a wheelchair)?: A Little Help needed standing up from a chair using your arms (e.g., wheelchair or bedside chair)?: A Lot Help needed to walk in hospital room?: A Lot Help needed climbing 3-5 steps with a railing? : Total 6 Click Score: 14    End of Session Equipment Utilized During Treatment: Gait belt;Right knee immobilizer Activity Tolerance: Patient limited by fatigue;Patient limited by pain Patient left: with bed alarm set;with call bell/phone within reach Nurse Communication: Mobility status PT Visit Diagnosis: Muscle weakness (generalized) (M62.81);Difficulty in walking, not elsewhere classified (R26.2);Pain Pain - Right/Left: Right Pain - part of body: Hip     Time: 8544-8478 PT Time Calculation (min) (ACUTE ONLY): 26 min  Charges:    $Therapeutic Exercise: 8-22  mins $Therapeutic Activity: 8-22 mins PT General Charges $$ ACUTE PT VISIT: 1 Visit                     Carmin JONELLE Deed, DPT 10/08/2024, 4:17 PM

## 2024-10-08 NOTE — Plan of Care (Signed)
  Problem: Education: Goal: Knowledge of General Education information will improve Description: Including pain rating scale, medication(s)/side effects and non-pharmacologic comfort measures Outcome: Progressing   Problem: Clinical Measurements: Goal: Ability to maintain clinical measurements within normal limits will improve Outcome: Progressing   Problem: Activity: Goal: Risk for activity intolerance will decrease Outcome: Progressing   Problem: Pain Managment: Goal: General experience of comfort will improve and/or be controlled Outcome: Progressing   Problem: Safety: Goal: Ability to remain free from injury will improve Outcome: Progressing   Problem: Activity: Goal: Ability to avoid complications of mobility impairment will improve Outcome: Progressing Goal: Ability to tolerate increased activity will improve Outcome: Progressing   Problem: Clinical Measurements: Goal: Postoperative complications will be avoided or minimized Outcome: Progressing   Problem: Pain Management: Goal: Pain level will decrease with appropriate interventions Outcome: Progressing   Problem: Skin Integrity: Goal: Will show signs of wound healing Outcome: Progressing

## 2024-10-08 NOTE — NC FL2 (Signed)
 [place]  MEDICAID FL2 LEVEL OF CARE FORM     IDENTIFICATION  Patient Name: Loretta Ramsey Birthdate: [birth year] Sex: female Admission Date (Current Location): 10/05/2024  [place] Number:      Facility and Address:  [facility], [address], [place], [place] [address]      Provider Number: [id number]  Attending Physician Name and Address:  Lenon Marien CROME, MD  Relative Name and Phone Number:  Anette George (Daughgter) [phone]    Current Level of Care: Hospital Recommended Level of Care: Skilled Nursing Facility Prior Approval Number:    Date Approved/Denied:   PASRR Number: [id number]  Discharge Plan: SNF    Current Diagnoses: Patient Active Problem List   Diagnosis Date Noted   Closed displaced fracture of right femoral neck (HCC) 10/05/2024    Orientation RESPIRATION BLADDER Height & Weight     Self, Time, Situation, Place  Normal Continent Weight: 69.4 kg Height:  5' 6 (167.6 cm)  BEHAVIORAL SYMPTOMS/MOOD NEUROLOGICAL BOWEL NUTRITION STATUS     (N/A) Continent Diet (Diet regular with thin liquids)  AMBULATORY STATUS COMMUNICATION OF NEEDS Skin     Verbally Surgical wounds (Right Hip)                       Personal Care Assistance Level of Assistance  Bathing, Feeding, Dressing, Total care Bathing Assistance: Limited assistance Feeding assistance: Independent Dressing Assistance: Limited assistance     Functional Limitations Info  Sight (wears glasses)          SPECIAL CARE FACTORS FREQUENCY  PT (By licensed PT), OT (By licensed OT) (Had right hip hemiarthroplasty so has a honeycomb dressing and knee immobilizer just to prevent her from crossing her legs.)     PT Frequency: 5 x a wk OT Frequency: 5 x a wk            Contractures Contractures Info: Not present    Additional Factors Info  Code Status Code Status Info: Full             Current Medications (10/08/2024):  This is  the current hospital active medication list Current Facility-Administered Medications  Medication Dose Route Frequency Provider Last Rate Last Admin   acetaminophen (TYLENOL) tablet 650 mg  650 mg Oral Q6H PRN Ponnala, Shruthi, MD       bisacodyl (DULCOLAX) suppository 10 mg  10 mg Rectal Daily PRN Tobie Priest, MD       buPROPion (WELLBUTRIN XL) 24 hr tablet 300 mg  300 mg Oral Daily Ponnala, Shruthi, MD   300 mg at 10/08/24 0859   enoxaparin (LOVENOX) injection 40 mg  40 mg Subcutaneous Q24H Tobie Priest, MD   40 mg at 10/08/24 0859   lactulose (CHRONULAC) 10 GM/15ML solution 20 g  20 g Oral Daily Kip Lynwood Double, PA-C   20 g at 10/08/24 9141   menthol (CEPACOL) lozenge 3 mg  1 lozenge Oral PRN Tobie Priest, MD       Or   phenol (CHLORASEPTIC) mouth spray 1 spray  1 spray Mouth/Throat PRN Tobie Priest, MD       ondansetron (ZOFRAN) injection 4 mg  4 mg Intravenous Q6H PRN Ponnala, Shruthi, MD   4 mg at 10/07/24 2257   ondansetron (ZOFRAN) tablet 4 mg  4 mg Oral Q6H PRN Ponnala, Shruthi, MD       oxyCODONE (Oxy IR/ROXICODONE) immediate release tablet 2.5 mg  2.5 mg Oral Q4H PRN Ponnala, Shruthi,  MD   2.5 mg at 10/05/24 2119   oxyCODONE (Oxy IR/ROXICODONE) immediate release tablet 5 mg  5 mg Oral Q4H PRN Ponnala, Shruthi, MD   5 mg at 10/08/24 0250   pantoprazole (PROTONIX) EC tablet 40 mg  40 mg Oral QHS Ponnala, Shruthi, MD   40 mg at 10/07/24 2125   senna (SENOKOT) tablet 8.6 mg  1 tablet Oral BID Tobie Priest, MD   8.6 mg at 10/08/24 0859   sodium chloride  flush (NS) 0.9 % injection 3 mL  3 mL Intravenous Q12H Ponnala, Shruthi, MD   3 mL at 10/08/24 0859   sodium phosphate (FLEET) enema 1 enema  1 enema Rectal Once PRN Tobie Priest, MD       traZODone (DESYREL) tablet 50 mg  50 mg Oral QHS Ponnala, Shruthi, MD   50 mg at 10/07/24 2125   zolpidem (AMBIEN) tablet 5 mg  5 mg Oral QHS PRN Tobie Priest, MD         Discharge Medications: Please see discharge summary for a list of discharge  medications.  Relevant Imaging Results:  Relevant Lab Results:   Additional Information SS#[id number]  Marinda Cooks, RN

## 2024-10-09 DIAGNOSIS — S72001A Fracture of unspecified part of neck of right femur, initial encounter for closed fracture: Secondary | ICD-10-CM | POA: Diagnosis not present

## 2024-10-09 LAB — CBC
HCT: 27.9 % — ABNORMAL LOW (ref 36.0–46.0)
Hemoglobin: 8.8 g/dL — ABNORMAL LOW (ref 12.0–15.0)
MCH: 34 pg (ref 26.0–34.0)
MCHC: 31.5 g/dL (ref 30.0–36.0)
MCV: 107.7 fL — ABNORMAL HIGH (ref 80.0–100.0)
Platelets: 243 K/uL (ref 150–400)
RBC: 2.59 MIL/uL — ABNORMAL LOW (ref 3.87–5.11)
RDW: 12.5 % (ref 11.5–15.5)
WBC: 8.5 K/uL (ref 4.0–10.5)
nRBC: 0 % (ref 0.0–0.2)

## 2024-10-09 MED ORDER — SODIUM CHLORIDE 0.9 % IV SOLN
INTRAVENOUS | Status: AC
Start: 2024-10-09 — End: 2024-10-09

## 2024-10-09 NOTE — Progress Notes (Signed)
 OT Cancellation Note  Patient Details Name: Loretta Ramsey MRN: [id number] DOB: 1946-02-03   Cancelled Treatment:    Reason Eval/Treat Not Completed: Medical issues which prohibited therapy. Pt with multiple episodes of hypotension today. OT attempting to see pt and she reports feeling very fatigued and having recently returned to bed. Pt requesting therapist to re-attempt tomorrow if able.   Izetta Claude, MS, OTR/L , CBIS ascom [phone]  10/09/24, 3:59 PM

## 2024-10-09 NOTE — Progress Notes (Signed)
 PROGRESS NOTE Loretta Ramsey  FMW:[id number] DOB: [birth year] DOA: 10/05/2024 PCP: Cleotilde Oneil FALCON, MD  Chief Complaint  Patient presents with   Hip Pain   Hospital Course:  Loretta Ramsey is a 78 y.o. female with medical history significant of GERD, Depression, urinary incontinence presented with right-sided pain after she slipped and fell on the concrete porch. Admitted for right femoral neck fracture s/p mechanical fall  Currently post-op and medically stable for dc to SNF when available.   Subjective: Patient had some lightheadedness when standing and transitioning to commode today. Recovered well once resting in seated position again. No LOC.   Objective: Vitals:   10/08/24 1448 10/08/24 1844 10/08/24 1938 10/09/24 0330  BP: (!) 133/56  (!) 137/56 (!) 143/63  Pulse: 92  100 98  Resp: 15  18 16   Temp: 98.5 F (36.9 C) 99 F (37.2 C) 99.1 F (37.3 C) 100 F (37.8 C)  TempSrc:    Oral  SpO2: 92%  95% 91%  Weight:      Height:        Intake/Output Summary (Last 24 hours) at 10/09/2024 0743 Last data filed at 10/09/2024 0500 Gross per 24 hour  Intake 360 ml  Output --  Net 360 ml   Filed Weights   10/05/24 1357  Weight: 69.4 kg   Examination: General: Awake, no distress.  CV:  RRR, no murmurs heard, no pedal edema Resp: clear to auscultation bilaterally MSK: Right hip and flank TTP, ROM decreased due to pain, skin abrasion R elbow Neuro: AO x 3, no gross focal deficits.  Assessment & Plan:  Right femoral neck fracture with impaction and varus angulation of the distal fragment- s/p Mechanical Fall - Seen by Orthopedics,s/p Right hip hemiarthroplasty 10/7 - Pain management, Antiemetics prn - PT/OT. Plan to go to SNF  Hypoxia- patient was on 2Lnc without a baseline use. She denies dyspnea. Possibly atelectasis post-op or some pulmonary contusion from fall. Doubt related to incidental finding on chest imaging. She has now been able to wean to room air without  dyspnea.  - continue to use incentive spirometer   Elevated BP- Not on meds at home. Low diastolics.  - Monitor BP    GERD - PPI   Depression - Wellbutrin, Trazodone   Incidental findings - 1.2 cm diameter ground glass nodule in RUL.  Follow-up with CT in 6 months  DVT prophylaxis: SCD   Code Status: Full Code Disposition:  TBD  Consultants:  Treatment Team:  Consulting Physician: Maryrose Cordella Charleston, MD  Procedures:  R hip hemiarthroplasty  Data Reviewed: I have personally reviewed following labs and imaging studies CBC: Recent Labs  Lab 10/05/24 1405 10/06/24 0405 10/07/24 0423  WBC 11.0* 8.3 9.5  NEUTROABS 7.7  --   --   HGB 12.5 11.3* 10.3*  HCT 38.6 34.5* 32.0*  MCV 106.9* 105.2* 104.9*  PLT 273 212 214   Basic Metabolic Panel: Recent Labs  Lab 10/05/24 1405 10/06/24 0405 10/07/24 0423  NA 136 137 137  K 4.2 3.9 4.4  CL 98 101 102  CO2 24 27 28   GLUCOSE 92 111* 130*  BUN 25* 21 17  CREATININE 0.89 0.68 0.57  CALCIUM 9.5 8.7* 8.9    LOS: 4 days  MDM: Patient is high risk for one or more organ failure.  They necessitate ongoing hospitalization for continued IV therapies and subsequent lab monitoring. Total time spent interpreting labs and vitals, reviewing the medical record, coordinating care amongst consultants  and care team members, directly assessing and discussing care with the patient and/or family: 35 min  Marien LITTIE Piety, MD [facility]  To contact the attending physician between 7A-7P please use Epic Chat. To contact the covering physician during after hours 7P-7A, please review Amion.  10/09/2024, 7:43 AM

## 2024-10-09 NOTE — Progress Notes (Signed)
 Physical Therapy Treatment Patient Details Name: Loretta Ramsey MRN: [id number] DOB: 02/18/1946 Today's Date: 10/09/2024   History of Present Illness 78 y/o female s/p fall with R hip fracture and subsequent total hip (posterior approach) 10/9.    PT Comments  Pt was seated in recliner upon arrival. She is A and O x4. Agreeable and cooperative. Does put forth great effort but remains limited. BP at rest 113/75. Pt stood with min-mod assist + extensive vcing for technique. Took ~ 6 antalgic/shuffling steps with RW prior to c/o severe onset of dizziness/nausea. Pt urgently returned to sitting in recliner with BP dropping to 76/54. After a few minutes, symptoms resolved. Pt requested to try to have BM on BSC. Unsuccessful BM attempt. PT BP returns to stable after staying reclined in chair for ~ 2 minutes. Care team notified of BP concerns. Acute PT will continue to follow and progress per current POC. DC recs remain appropriate to maximize her independence and safety with all ADLs     If plan is discharge home, recommend the following: A lot of help with walking and/or transfers;A lot of help with bathing/dressing/bathroom;Assistance with cooking/housework;Assistance with feeding;Assist for transportation     Equipment Recommendations  None recommended by PT (Defer to next level of care)       Precautions / Restrictions Precautions Precautions: Posterior Hip;Fall Precaution Booklet Issued: Yes (comment) Recall of Precautions/Restrictions: Intact Precaution/Restrictions Comments: pt still needing reminders to recall all 3/3 precuations Required Braces or Orthoses: Knee Immobilizer - Right Restrictions Weight Bearing Restrictions Per Provider Order: Yes RLE Weight Bearing Per Provider Order: Weight bearing as tolerated     Mobility  Bed Mobility  General bed mobility comments: In recliner pre/post session    Transfers Overall transfer level: Needs assistance Equipment used: Rolling  walker (2 wheels) Transfers: Sit to/from Stand Sit to Stand: Min assist, Mod assist  General transfer comment: Pt continues to require extensive assistance to stand and sit safely    Ambulation/Gait Ambulation/Gait assistance: Min assist Gait Distance (Feet): 6 Feet Assistive device: Rolling walker (2 wheels) Gait Pattern/deviations: Step-to pattern, Shuffle, Decreased stance time - right, Decreased step length - left Gait velocity: decreased  General Gait Details: Pt was able to ambulate ~ 6 ft prior to having severe onset of dizziness/nausea. BP drop from 113/75 in sitting to 76/54 with standing activity.    Balance Overall balance assessment: Needs assistance Sitting-balance support: Bilateral upper extremity supported Sitting balance-Leahy Scale: Good     Standing balance support: Bilateral upper extremity supported, During functional activity, Reliant on assistive device for balance Standing balance-Leahy Scale: Poor Standing balance comment: Pt at high risk of falls due to concerns in BP dropping with change of positions       Communication Communication Communication: No apparent difficulties  Cognition Arousal: Alert Behavior During Therapy: WFL for tasks assessed/performed, Anxious   PT - Cognitive impairments: No apparent impairments    PT - Cognition Comments: Pt is A and O x 4 Following commands: Intact      Cueing Cueing Techniques: Verbal cues, Tactile cues     General Comments General comments (skin integrity, edema, etc.): Pt did attempt to have BM however unsuccessful. Did pass alot of gas but unable to have actual BM      Pertinent Vitals/Pain Pain Assessment Pain Assessment: 0-10 Pain Score: 6  Pain Location: R hip, increased with all movement Pain Descriptors / Indicators: Aching, Discomfort, Grimacing, Guarding Pain Intervention(s): Limited activity within patient's tolerance, Monitored during session,  Premedicated before session, Repositioned      PT Goals (current goals can now be found in the care plan section) Acute Rehab PT Goals Patient Stated Goal: get better Progress towards PT goals: Progressing toward goals    Frequency    BID       AM-PAC PT 6 Clicks Mobility   Outcome Measure  Help needed turning from your back to your side while in a flat bed without using bedrails?: A Little Help needed moving from lying on your back to sitting on the side of a flat bed without using bedrails?: A Little Help needed moving to and from a bed to a chair (including a wheelchair)?: A Little Help needed standing up from a chair using your arms (e.g., wheelchair or bedside chair)?: A Lot Help needed to walk in hospital room?: A Lot Help needed climbing 3-5 steps with a railing? : Total 6 Click Score: 14    End of Session Equipment Utilized During Treatment: Right knee immobilizer Activity Tolerance: Patient limited by fatigue;Other (comment) (Limited by hypotensive response to activity and change in positions) Patient left: in chair;with call bell/phone within reach;with chair alarm set;with nursing/sitter in room Nurse Communication: Mobility status PT Visit Diagnosis: Muscle weakness (generalized) (M62.81);Difficulty in walking, not elsewhere classified (R26.2);Pain Pain - Right/Left: Right Pain - part of body: Hip     Time: 9144-9083 PT Time Calculation (min) (ACUTE ONLY): 21 min  Charges:    $Therapeutic Activity: 8-22 mins PT General Charges $$ ACUTE PT VISIT: 1 Visit                    Rankin Essex PTA 10/09/24, 9:30 AM

## 2024-10-09 NOTE — Progress Notes (Signed)
 PT Cancellation Note  Patient Details Name: Loretta Ramsey MRN: [id number] DOB: 1946-05-17   Cancelled Treatment:     PT attempt for BID session. Bolus currently running. Pt sleeping soundly. Per RN staff, she recently was up to [facility] for successful BM and now sleeping soundly. Acute PT will continue to follow and return at a later time. DC recs remain appropriate to maximize independence and safety with all ADLs.    Loretta Ramsey 10/09/2024, 2:12 PM

## 2024-10-09 NOTE — Progress Notes (Signed)
  Subjective: 3 Days Post-Op Procedure(s) (LRB): HEMIARTHROPLASTY (BIPOLAR) HIP, POSTERIOR APPROACH FOR FRACTURE (Right) Patient reports pain mild in bed this morning, reports the hip is sore.   Son at bedside.  Patient is well, and has had no acute complaints or problems Plan is to go Rehab versus home after hospital stay.  Care management to assist with d/c planning. Negative for chest pain and shortness of breath Fever: no Gastrointestinal: Positive for nausea and vomiting during the night.  Improving.  Denies any currently.  Objective: Vital signs in last 24 hours: Temp:  [98.5 F (36.9 C)-100 F (37.8 C)] 99.1 F (37.3 C) (10/10 0749) Pulse Rate:  [85-100] 85 (10/10 0749) Resp:  [15-18] 16 (10/10 0749) BP: (133-143)/(56-63) 141/59 (10/10 0749) SpO2:  [91 %-95 %] 94 % (10/10 0749)  Intake/Output from previous day:  Intake/Output Summary (Last 24 hours) at 10/09/2024 0757 Last data filed at 10/09/2024 0500 Gross per 24 hour  Intake 360 ml  Output --  Net 360 ml    Intake/Output this shift: No intake/output data recorded.  Labs: Recent Labs    10/07/24 0423  HGB 10.3*   Recent Labs    10/07/24 0423  WBC 9.5  RBC 3.05*  HCT 32.0*  PLT 214   Recent Labs    10/07/24 0423  NA 137  K 4.4  CL 102  CO2 28  BUN 17  CREATININE 0.57  GLUCOSE 130*  CALCIUM 8.9   No results for input(s): LABPT, INR in the last 72 hours.   EXAM General - Patient is Alert and Oriented Extremity - Neurovascular intact Sensation intact distally Dorsiflexion/Plantar flexion intact Compartment soft With the knee immobilizer in place Dressing/Incision - clean, dry, no drainage noted to the left hip honeycomb. Motor Function - intact, moving foot and toes well on exam.   Past Medical History:  Diagnosis Date   Arteriovenous malformation of liver    B12 deficiency    Cervical disc disease    Depression    Hypertension    Osteoarthritis     Assessment/Plan: 3 Days  Post-Op Procedure(s) (LRB): HEMIARTHROPLASTY (BIPOLAR) HIP, POSTERIOR APPROACH FOR FRACTURE (Right) Principal Problem:   Closed displaced fracture of right femoral neck (HCC)  Estimated body mass index is 24.69 kg/m as calculated from the following:   Height as of this encounter: 5' 6 (1.676 m).   Weight as of this encounter: 69.4 kg. Advance diet Up with therapy  Vitals reviewed.  Temp 100 overnight, encouraged incentive spirometer. CBC order. Continue to work on a BM. Suppository given. Continue with PT, current plan is probable SNF.  Discharge planning: Dressing changes as needed.  No showering.  Follow-up at [facility] clinic orthopedics for x-rays and staple removal in 2 weeks.  Posterior hip precautions  DVT Prophylaxis - Lovenox and TED hose Weight-Bearing as tolerated to right leg  J. Gustavo Level, PA-C, CAQ-OS [facility] 10/09/2024, 7:57 AM

## 2024-10-10 DIAGNOSIS — S72001A Fracture of unspecified part of neck of right femur, initial encounter for closed fracture: Secondary | ICD-10-CM | POA: Diagnosis not present

## 2024-10-10 LAB — CBC
HCT: 24.1 % — ABNORMAL LOW (ref 36.0–46.0)
Hemoglobin: 7.9 g/dL — ABNORMAL LOW (ref 12.0–15.0)
MCH: 34.8 pg — ABNORMAL HIGH (ref 26.0–34.0)
MCHC: 32.8 g/dL (ref 30.0–36.0)
MCV: 106.2 fL — ABNORMAL HIGH (ref 80.0–100.0)
Platelets: 223 K/uL (ref 150–400)
RBC: 2.27 MIL/uL — ABNORMAL LOW (ref 3.87–5.11)
RDW: 12.3 % (ref 11.5–15.5)
WBC: 7.2 K/uL (ref 4.0–10.5)
nRBC: 0 % (ref 0.0–0.2)

## 2024-10-10 LAB — PREPARE RBC (CROSSMATCH)

## 2024-10-10 LAB — HEMOGLOBIN AND HEMATOCRIT, BLOOD
HCT: 29 % — ABNORMAL LOW (ref 36.0–46.0)
Hemoglobin: 9.6 g/dL — ABNORMAL LOW (ref 12.0–15.0)

## 2024-10-10 MED ORDER — ACETAMINOPHEN 325 MG PO TABS
325.0000 mg | ORAL_TABLET | Freq: Four times a day (QID) | ORAL | Status: DC | PRN
  Administered 2024-10-11 – 2024-10-13 (×3): 650 mg via ORAL
  Filled 2024-10-10 (×3): qty 2

## 2024-10-10 MED ORDER — FE FUM-VIT C-VIT B12-FA 460-60-0.01-1 MG PO CAPS
1.0000 | ORAL_CAPSULE | Freq: Every day | ORAL | Status: DC
  Administered 2024-10-10 – 2024-10-13 (×4): 1 via ORAL
  Filled 2024-10-10 (×4): qty 1

## 2024-10-10 MED ORDER — SODIUM CHLORIDE 0.9% IV SOLUTION: Freq: Once | INTRAVENOUS | Status: AC

## 2024-10-10 NOTE — Progress Notes (Signed)
 PT Cancellation Note  Patient Details Name: Loretta Ramsey MRN: [id number] DOB: Mar 11, 1946   Cancelled Treatment:     Pt is currently receiving blood and has been severely limited by orthostatic hypotension throughout admission. Acute PT will return tomorrow post transfusion in hopes pt can tolerate greater OOB activity safely.    [name] 10/10/2024, 3:34 PM

## 2024-10-10 NOTE — Plan of Care (Signed)
   Problem: Education: Goal: Knowledge of General Education information will improve Description: Including pain rating scale, medication(s)/side effects and non-pharmacologic comfort measures Outcome: Progressing   Problem: Clinical Measurements: Goal: Will remain free from infection Outcome: Progressing

## 2024-10-10 NOTE — Progress Notes (Signed)
 Physical Therapy Treatment Ramsey Details Name: Loretta Ramsey MRN: [id number] DOB: 1946/06/27 Today's Date: 10/10/2024   History of Present Illness 78 y/o female s/p fall with R hip fracture and subsequent total hip (posterior approach) 10/9.    PT Comments  Pt was long sitting in bed upon arrival. She is A and O but unaware she had BM and urinated on self. Pt is agreeable to OOB activity. Noted continued drop in Hgb since surgery. Ortho PA aware and hospitalist aware.  BP in bed prior to EOB sitting: 136/54(76) BP seated EOB:110/52 Standing 80/46 (53) pt symptomatic requiring seated rest. RN and Chartered loss adjuster assisted with hygiene care due to incontinence.  After several minutes in sitting BP 110/59(72) Pt stood and took steps to recliner however unable to remain standing for formal BP reading. Once reading acquired; BP 78/54 with pt urgently needing seated rest with BLE elevated. Symptoms resolve after ~ 1 minute. Acute PT will continue to follow and progress per current POC. DC recs remain appropriate to maximize independence and safety with all ADLs         If plan is discharge home, recommend the following: A lot of help with walking and/or transfers;A lot of help with bathing/dressing/bathroom;Assistance with cooking/housework;Assistance with feeding;Assist for transportation     Equipment Recommendations  None recommended by PT (Defer to next level of care)       Precautions / Restrictions Precautions Precautions: Posterior Hip;Fall Precaution Booklet Issued: Yes (comment) Recall of Precautions/Restrictions: Intact Precaution/Restrictions Comments: pt still needing reminders to recall all 3/3 precuations Required Braces or Orthoses: Knee Immobilizer - Right Restrictions Weight Bearing Restrictions Per Provider Order: Yes RLE Weight Bearing Per Provider Order: Weight bearing as tolerated     Mobility  Bed Mobility Overal bed mobility: Needs Assistance Bed Mobility: Supine to  Sit, Sit to Supine  Supine to sit: Contact guard, Min assist, Used rails  General bed mobility comments: pt does continue to require physical assistance to achieve EOB sitting. BP at rest 136/54. Dropped 110/52 in sitting    Transfers Overall transfer level: Needs assistance Equipment used: Rolling walker (2 wheels) Transfers: Sit to/from Stand Sit to Stand: Min assist, Mod assist  General transfer comment: BP dropped to 80/46 (53)    Ambulation/Gait Ambulation/Gait assistance: Min assist Assistive device: Rolling walker (2 wheels) Gait Pattern/deviations: Step-to pattern, Shuffle, Decreased stance time - right, Decreased step length - left Gait velocity: decreased General Gait Details: pt was able to tolerate a few steps to recliner however severe dizziness requiring urgent seated rest with BLEs elevated. BP once able to get reading 78/52    Balance Overall balance assessment: Needs assistance Sitting-balance support: Bilateral upper extremity supported Sitting balance-Leahy Scale: Good     Standing balance support: Bilateral upper extremity supported, During functional activity, Reliant on assistive device for balance Standing balance-Leahy Scale: Fair Standing balance comment: Pt at high risk of falls due to concerns in BP dropping with change of positions. reliant on RW for all standing activity    Communication Communication Communication: No apparent difficulties  Cognition Arousal: Alert Behavior During Therapy: WFL for tasks assessed/performed, Anxious   PT - Cognitive impairments: No apparent impairments    PT - Cognition Comments: Pt is A and O x 4 Following commands: Intact      Cueing Cueing Techniques: Verbal cues, Tactile cues         Pertinent Vitals/Pain Pain Assessment Pain Assessment: 0-10 Pain Score: 2  Pain Location: R hip, increased with all  movement Pain Descriptors / Indicators: Aching, Discomfort, Grimacing, Guarding     PT Goals (current  goals can now be found in the care plan section) Acute Rehab PT Goals Ramsey Stated Goal: get better Progress towards PT goals: Progressing toward goals    Frequency    BID       AM-PAC PT 6 Clicks Mobility   Outcome Measure  Help needed turning from your back to your side while in a flat bed without using bedrails?: A Little Help needed moving from lying on your back to sitting on the side of a flat bed without using bedrails?: A Little Help needed moving to and from a bed to a chair (including a wheelchair)?: A Little Help needed standing up from a chair using your arms (e.g., wheelchair or bedside chair)?: A Lot Help needed to walk in hospital room?: A Lot Help needed climbing 3-5 steps with a railing? : Total 6 Click Score: 14    End of Session Equipment Utilized During Treatment: Right knee immobilizer Activity Tolerance: Ramsey limited by fatigue;Other (comment) (Limited by hypotensive response to activity and change in positions) Ramsey left: in chair;with call bell/phone within reach;with chair alarm set;with nursing/sitter in room Nurse Communication: Mobility status PT Visit Diagnosis: Muscle weakness (generalized) (M62.81);Difficulty in walking, not elsewhere classified (R26.2);Pain Pain - Right/Left: Right Pain - part of body: Hip     Time: 9196-9181 PT Time Calculation (min) (ACUTE ONLY): 15 min  Charges:    $Therapeutic Activity: 8-22 mins PT General Charges $$ ACUTE PT VISIT: 1 Visit                    Rankin Essex PTA 10/10/24, 8:32 AM

## 2024-10-10 NOTE — Progress Notes (Signed)
  Subjective: 4 Days Post-Op Procedure(s) (LRB): HEMIARTHROPLASTY (BIPOLAR) HIP, POSTERIOR APPROACH FOR FRACTURE (Right) Patient reports pain as mild Patient is well, and has had no acute complaints or problems Plan is to go Rehab  Negative for chest pain and shortness of breath Fever: no + BM today.   Objective: Vital signs in last 24 hours: Temp:  [98 F (36.7 C)-99.4 F (37.4 C)] 98 F (36.7 C) (10/11 0804) Pulse Rate:  [95-97] 97 (10/11 0804) Resp:  [17-20] 20 (10/11 0804) BP: (110-153)/(53-61) 110/59 (10/11 0804) SpO2:  [94 %-97 %] 95 % (10/11 0804)  Intake/Output from previous day:  Intake/Output Summary (Last 24 hours) at 10/10/2024 0805 Last data filed at 10/09/2024 1528 Gross per 24 hour  Intake 464.96 ml  Output 450 ml  Net 14.96 ml    Intake/Output this shift: No intake/output data recorded.  Labs: Recent Labs    10/09/24 0816 10/10/24 0242  HGB 8.8* 7.9*   Recent Labs    10/09/24 0816 10/10/24 0242  WBC 8.5 7.2  RBC 2.59* 2.27*  HCT 27.9* 24.1*  PLT 243 223   No results for input(s): NA, K, CL, CO2, BUN, CREATININE, GLUCOSE, CALCIUM in the last 72 hours.  No results for input(s): LABPT, INR in the last 72 hours.   EXAM General - Patient is Alert and Oriented Extremity - Neurovascular intact Sensation intact distally Dorsiflexion/Plantar flexion intact Compartment soft With the knee immobilizer in place Dressing/Incision - clean, dry, no drainage noted to the left hip honeycomb. Motor Function - intact, moving foot and toes well on exam.   Past Medical History:  Diagnosis Date   Arteriovenous malformation of liver    B12 deficiency    Cervical disc disease    Depression    Hypertension    Osteoarthritis     Assessment/Plan: 4 Days Post-Op Procedure(s) (LRB): HEMIARTHROPLASTY (BIPOLAR) HIP, POSTERIOR APPROACH FOR FRACTURE (Right) Principal Problem:   Closed displaced fracture of right femoral neck  (HCC)  Estimated body mass index is 24.69 kg/m as calculated from the following:   Height as of this encounter: 5' 6 (1.676 m).   Weight as of this encounter: 69.4 kg. Advance diet Up with therapy Pain well controlled VSS, orthostatic with PT. afebrile Acute post op blood loss anemia - Hgb 7.9, on Fe supplement.  Orthostasis with PT today.  Hemoglobin trending down.  Will transfuse 1 unit of packed red blood cells today and recheck hemoglobin post transfusion and tomorrow. + BM today   Discharge planning: Dressing changes as needed.  No showering.  [facility]up [facility] orthoped[facility] for x-rays and staple removal in 2 weeks.  Posterior hip precautions  DVT Prophylaxis - Lovenox and TED hose Weight-Bearing as tolerated to right leg  T. Medford Amber, PA-C [facility] 10/10/2024, 8:05 AM

## 2024-10-10 NOTE — Progress Notes (Signed)
 PROGRESS NOTE Loretta Ramsey  FMW:[id number] DOB: May 11, 1946 DOA: 10/05/2024 PCP: Cleotilde Oneil FALCON, MD  Chief Complaint  Patient presents with   Hip Pain   Hospital Course:  Loretta Ramsey is a 78 y.o. female with medical history significant of GERD, Depression, urinary incontinence presented with right-sided pain after she slipped and fell on the concrete porch. Admitted for right femoral neck fracture s/p mechanical fall Currently post-op and medically stable for dc to SNF when available.   Subjective: Patient had some lightheadedness when standing and transitioning to commode yesterday. Resolved with rest. Given IVF and still had some lightheadedness after standing for a prolonged period today. No LOC.  Objective: Vitals:   10/09/24 0749 10/09/24 1528 10/09/24 2036 10/10/24 0346  BP: (!) 141/59 (!) 153/61 (!) 148/54 (!) 130/53  Pulse: 85 95 96 95  Resp: 16 17 17 17   Temp: 99.1 F (37.3 C) 99.4 F (37.4 C) 99.4 F (37.4 C) 98.2 F (36.8 C)  TempSrc:   Oral Oral  SpO2: 94% 97% 95% 94%  Weight:      Height:        Intake/Output Summary (Last 24 hours) at 10/10/2024 0728 Last data filed at 10/09/2024 1528 Gross per 24 hour  Intake 464.96 ml  Output 450 ml  Net 14.96 ml   Filed Weights   10/05/24 1357  Weight: 69.4 kg   Examination: General: Awake, no distress.  CV:  RRR, no murmurs heard, no pedal edema Resp: clear to auscultation bilaterally MSK: Right hip and flank TTP, ROM decreased due to pain, skin abrasion R elbow Neuro: AO x 3, no gross focal deficits.  Assessment & Plan:  Right femoral neck fracture with impaction and varus angulation of the distal fragment- s/p Mechanical Fall - Seen by Orthopedics,s/p Right hip hemiarthroplasty 10/7 - Pain management, Antiemetics prn - PT/OT. Plan to go to SNF  Post-op anemia- hgb 7.9 from 12.5 on presentation Patient had some lightheadedness when standing and transitioning to commode yesterday. Resolved with rest.  Given IVF and still had some lightheadedness after standing for a prolonged period today. No LOC. - transfusing 1u pRBCs today, follow up CBC and in am. No active bleeding appreciated. May be some dilutional component today from the IVFs.   Hypoxia- patient was on 2Lnc without a baseline use. She denies dyspnea. Possibly atelectasis post-op or some pulmonary contusion from fall. Doubt related to incidental finding on chest imaging. She has now been able to wean to room air without dyspnea.  - continue to use incentive spirometer   Elevated BP- Not on meds at home. Low diastolics.  - Monitor BP    GERD - PPI   Depression - Wellbutrin, Trazodone   Incidental findings - 1.2 cm diameter ground glass nodule in RUL.  Follow-up with CT in 6 months  DVT prophylaxis: SCD   Code Status: Full Code Disposition:  TBD  Consultants:  Treatment Team:  Consulting Physician: Maryrose Cordella Charleston, MD  Procedures:  R hip hemiarthroplasty  Data Reviewed: I have personally reviewed following labs and imaging studies CBC: Recent Labs  Lab 10/05/24 1405 10/06/24 0405 10/07/24 0423 10/09/24 0816 10/10/24 0242  WBC 11.0* 8.3 9.5 8.5 7.2  NEUTROABS 7.7  --   --   --   --   HGB 12.5 11.3* 10.3* 8.8* 7.9*  HCT 38.6 34.5* 32.0* 27.9* 24.1*  MCV 106.9* 105.2* 104.9* 107.7* 106.2*  PLT 273 212 214 243 223   Basic Metabolic Panel: Recent Labs  Lab 10/05/24 1405 10/06/24 0405 10/07/24 0423  NA 136 137 137  K 4.2 3.9 4.4  CL 98 101 102  CO2 24 27 28   GLUCOSE 92 111* 130*  BUN 25* 21 17  CREATININE 0.89 0.68 0.57  CALCIUM 9.5 8.7* 8.9    LOS: 5 days  MDM: Patient is high risk for one or more organ failure.  They necessitate ongoing hospitalization for continued IV therapies and subsequent lab monitoring. Total time spent interpreting labs and vitals, reviewing the medical record, coordinating care amongst consultants and care team members, directly assessing and discussing care with the  patient and/or family: 35 min  Loretta LITTIE Piety, MD [facility]  To contact the attending physician between 7A-7P please use Epic Chat. To contact the covering physician during after hours 7P-7A, please review Amion.  10/10/2024, 7:28 AM

## 2024-10-11 DIAGNOSIS — S72001A Fracture of unspecified part of neck of right femur, initial encounter for closed fracture: Secondary | ICD-10-CM | POA: Diagnosis not present

## 2024-10-11 LAB — BPAM RBC
Blood Product Expiration Date: 202511092359
ISSUE DATE / TIME: 202510111346
Unit Type and Rh: 5100

## 2024-10-11 LAB — TYPE AND SCREEN
ABO/RH(D): O POS
Antibody Screen: NEGATIVE
Unit division: 0

## 2024-10-11 LAB — CBC
HCT: 28.3 % — ABNORMAL LOW (ref 36.0–46.0)
Hemoglobin: 9.3 g/dL — ABNORMAL LOW (ref 12.0–15.0)
MCH: 33.2 pg (ref 26.0–34.0)
MCHC: 32.9 g/dL (ref 30.0–36.0)
MCV: 101.1 fL — ABNORMAL HIGH (ref 80.0–100.0)
Platelets: 282 K/uL (ref 150–400)
RBC: 2.8 MIL/uL — ABNORMAL LOW (ref 3.87–5.11)
RDW: 13.7 % (ref 11.5–15.5)
WBC: 7.1 K/uL (ref 4.0–10.5)
nRBC: 0 % (ref 0.0–0.2)

## 2024-10-11 MED ORDER — ORAL CARE MOUTH RINSE
15.0000 mL | OROMUCOSAL | Status: DC | PRN
Start: 2024-10-11 — End: 2024-10-13

## 2024-10-11 NOTE — Progress Notes (Signed)
  Subjective: 5 Days Post-Op Procedure(s) (LRB): HEMIARTHROPLASTY (BIPOLAR) HIP, POSTERIOR APPROACH FOR FRACTURE (Right) Patient doing well this am. Ambulating well with PT. Asymptomatic.  Patient is well, and has had no acute complaints or problems Plan is to go Rehab  Negative for chest pain and shortness of breath Fever: no + BM   Objective: Vital signs in last 24 hours: Temp:  [98 F (36.7 C)-98.6 F (37 C)] 98.6 F (37 C) (10/12 0327) Pulse Rate:  [84-92] 89 (10/12 0720) Resp:  [14-20] 14 (10/12 0327) BP: (127-168)/(50-66) 140/55 (10/12 0720) SpO2:  [94 %-97 %] 96 % (10/12 0720)  Intake/Output from previous day:  Intake/Output Summary (Last 24 hours) at 10/11/2024 0958 Last data filed at 10/10/2024 2140 Gross per 24 hour  Intake 430 ml  Output 800 ml  Net -370 ml    Intake/Output this shift: No intake/output data recorded.  Labs: Recent Labs    10/09/24 0816 10/10/24 0242 10/10/24 1929 10/11/24 0332  HGB 8.8* 7.9* 9.6* 9.3*   Recent Labs    10/10/24 0242 10/10/24 1929 10/11/24 0332  WBC 7.2  --  7.1  RBC 2.27*  --  2.80*  HCT 24.1* 29.0* 28.3*  PLT 223  --  282   No results for input(s): NA, K, CL, CO2, BUN, CREATININE, GLUCOSE, CALCIUM in the last 72 hours.  No results for input(s): LABPT, INR in the last 72 hours.   EXAM General - Patient is Alert and Oriented Extremity - Neurovascular intact Sensation intact distally Dorsiflexion/Plantar flexion intact Compartment soft With the knee immobilizer in place Dressing/Incision - clean, dry, no drainage noted to the left hip honeycomb. Motor Function - intact, moving foot and toes well on exam.   Past Medical History:  Diagnosis Date   Arteriovenous malformation of liver    B12 deficiency    Cervical disc disease    Depression    Hypertension    Osteoarthritis     Assessment/Plan: 5 Days Post-Op Procedure(s) (LRB): HEMIARTHROPLASTY (BIPOLAR) HIP, POSTERIOR APPROACH  FOR FRACTURE (Right) Principal Problem:   Closed displaced fracture of right femoral neck (HCC)  Estimated body mass index is 24.69 kg/m as calculated from the following:   Height as of this encounter: 5' 6 (1.676 m).   Weight as of this encounter: 69.4 kg. Advance diet Up with therapy Pain well controlled VSS Acute post op blood loss anemia - Hgb 9.3, s/p 1 unit PRBC 10/10/24. Continue with Fe supplement.  Non orthostatic today with PT.   Discharge planning: Dressing changes as needed.  No showering.  Follow-up at [facility] clinic orthopedics for x-rays and staple removal in 2 weeks.  Posterior hip precautions  DVT Prophylaxis - Lovenox and TED hose Weight-Bearing as tolerated to right leg  T. Medford Amber, PA-C [facility] 10/11/2024, 9:58 AM

## 2024-10-11 NOTE — Progress Notes (Signed)
 PROGRESS NOTE Loretta Ramsey  FMW:[id number] DOB: 04/01/1946 DOA: 10/05/2024 PCP: Cleotilde Oneil FALCON, MD  Chief Complaint  Patient presents with   Hip Pain   Hospital Course:  Loretta Ramsey is a 78 y.o. female with medical history significant of GERD, Depression, urinary incontinence presented with right-sided pain after she slipped and fell on the concrete porch. Admitted for right femoral neck fracture s/p mechanical fall Currently post-op and medically stable for dc to SNF when available.   Subjective: Patient had improved symptoms with standing. No lightheadedness. Feels like she needs to have a BM today.   Objective: Vitals:   10/10/24 1958 10/11/24 0327 10/11/24 0718 10/11/24 0720  BP: (!) 151/51 (!) 168/66 (!) 143/50 (!) 140/55  Pulse: 85 92 84 89  Resp: 14 14    Temp: 98.5 F (36.9 C) 98.6 F (37 C)    TempSrc:      SpO2: 96% 94% 96% 96%  Weight:      Height:        Intake/Output Summary (Last 24 hours) at 10/11/2024 0734 Last data filed at 10/10/2024 2140 Gross per 24 hour  Intake 430 ml  Output 800 ml  Net -370 ml   Filed Weights   10/05/24 1357  Weight: 69.4 kg   Examination: General: Awake, no distress.  CV:  RRR, no murmurs heard, no pedal edema Resp: clear to auscultation bilaterally MSK: Right hip and flank TTP, ROM decreased due to pain, skin abrasion R elbow Neuro: AO x 3, no gross focal deficits.  Assessment & Plan:  Right femoral neck fracture with impaction and varus angulation of the distal fragment- s/p Mechanical Fall - Seen by Orthopedics,s/p Right hip hemiarthroplasty 10/7 - Pain management, Antiemetics prn - PT/OT. Plan to go to SNF  Post-op anemia- hgb 7.9 from 12.5 on presentation Patient had some lightheadedness when standing and transitioning to commode yesterday. Resolved with rest. Given IVF and still had some lightheadedness after standing for a prolonged period today. No LOC. - s/p 1u pRBC yesterday. Now hgb stsable  9.3  Hypoxia- patient was on 2Lnc without a baseline use. She denies dyspnea. Possibly atelectasis post-op or some pulmonary contusion from fall. Doubt related to incidental finding on chest imaging. She has now been able to wean to room air without dyspnea.  - continue to use incentive spirometer   Elevated BP- Not on meds at home. Low diastolics.  - Monitor BP    GERD - PPI   Depression - Wellbutrin, Trazodone   Incidental findings - 1.2 cm diameter ground glass nodule in RUL.  Follow-up with CT in 6 months  DVT prophylaxis: SCD   Code Status: Full Code Disposition:  TBD  Consultants:  Treatment Team:  Consulting Physician: Maryrose Cordella Charleston, MD  Procedures:  R hip hemiarthroplasty  Data Reviewed: I have personally reviewed following labs and imaging studies CBC: Recent Labs  Lab 10/05/24 1405 10/06/24 0405 10/07/24 0423 10/09/24 0816 10/10/24 0242 10/10/24 1929 10/11/24 0332  WBC 11.0* 8.3 9.5 8.5 7.2  --  7.1  NEUTROABS 7.7  --   --   --   --   --   --   HGB 12.5 11.3* 10.3* 8.8* 7.9* 9.6* 9.3*  HCT 38.6 34.5* 32.0* 27.9* 24.1* 29.0* 28.3*  MCV 106.9* 105.2* 104.9* 107.7* 106.2*  --  101.1*  PLT 273 212 214 243 223  --  282   Basic Metabolic Panel: Recent Labs  Lab 10/05/24 1405 10/06/24 0405 10/07/24 0423  NA 136 137 137  K 4.2 3.9 4.4  CL 98 101 102  CO2 24 27 28   GLUCOSE 92 111* 130*  BUN 25* 21 17  CREATININE 0.89 0.68 0.57  CALCIUM 9.5 8.7* 8.9    LOS: 6 days  MDM: Patient is high risk for one or more organ failure.  They necessitate ongoing hospitalization for continued IV therapies and subsequent lab monitoring. Total time spent interpreting labs and vitals, reviewing the medical record, coordinating care amongst consultants and care team members, directly assessing and discussing care with the patient and/or family: 35 min  Loretta LITTIE Piety, MD [facility]  To contact the attending physician between 7A-7P please use Epic  Chat. To contact the covering physician during after hours 7P-7A, please review Amion.  10/11/2024, 7:34 AM

## 2024-10-11 NOTE — Plan of Care (Signed)

## 2024-10-11 NOTE — Plan of Care (Signed)
  Problem: Education: Goal: Knowledge of General Education information will improve Description: Including pain rating scale, medication(s)/side effects and non-pharmacologic comfort measures Outcome: Progressing   Problem: Health Behavior/Discharge Planning: Goal: Ability to manage health-related needs will improve Outcome: Progressing   Problem: Clinical Measurements: Goal: Ability to maintain clinical measurements within normal limits will improve Outcome: Progressing Goal: Will remain free from infection Outcome: Progressing Goal: Diagnostic test results will improve Outcome: Progressing Goal: Respiratory complications will improve Outcome: Progressing Goal: Cardiovascular complication will be avoided Outcome: Progressing   Problem: Nutrition: Goal: Adequate nutrition will be maintained Outcome: Progressing   Problem: Coping: Goal: Level of anxiety will decrease Outcome: Progressing   Problem: Elimination: Goal: Will not experience complications related to bowel motility Outcome: Progressing Goal: Will not experience complications related to urinary retention Outcome: Progressing   Problem: Pain Managment: Goal: General experience of comfort will improve and/or be controlled Outcome: Progressing   Problem: Safety: Goal: Ability to remain free from injury will improve Outcome: Progressing   Problem: Skin Integrity: Goal: Risk for impaired skin integrity will decrease Outcome: Progressing   Problem: Education: Goal: Knowledge of the prescribed therapeutic regimen will improve Outcome: Progressing Goal: Understanding of discharge needs will improve Outcome: Progressing Goal: Individualized Educational Video(s) Outcome: Progressing   Problem: Activity: Goal: Ability to avoid complications of mobility impairment will improve Outcome: Progressing Goal: Ability to tolerate increased activity will improve Outcome: Progressing   Problem: Clinical  Measurements: Goal: Postoperative complications will be avoided or minimized Outcome: Progressing   Problem: Pain Management: Goal: Pain level will decrease with appropriate interventions Outcome: Progressing   Problem: Skin Integrity: Goal: Will show signs of wound healing Outcome: Progressing   Problem: Activity: Goal: Risk for activity intolerance will decrease Outcome: Not Progressing

## 2024-10-11 NOTE — Progress Notes (Signed)
 Physical Therapy Treatment Patient Details Name: Loretta Ramsey MRN: [id number] DOB: [birth year] Today's Date: 10/11/2024   History of Present Illness 78 y/o female s/p fall with R hip fracture and subsequent total hip (posterior approach) 10/9.    PT Comments  Pt ready for session.  In chair feeling better with no dizziness reported today.  She is able to stand with min a x 1 to RW and walk to door and back in room with cga/min a x 1.  Pt with good improvement in mobility today with increased tolerance.  Pt pleased with her progress today.  Opted for 2 walks then generally fatigued and declined further activity at this time.  Remained up with needs met.   If plan is discharge home, recommend the following: A little help with walking and/or transfers;A little help with bathing/dressing/bathroom;Help with stairs or ramp for entrance;Assist for transportation;Assistance with cooking/housework   Can travel by private vehicle        Equipment Recommendations  None recommended by PT    Recommendations for Other Services       Precautions / Restrictions Precautions Precautions: Posterior Hip;Fall Precaution Booklet Issued: Yes (comment) Recall of Precautions/Restrictions: Intact Precaution/Restrictions Comments: pt still needing reminders to recall all 3/3 precuations Required Braces or Orthoses: Knee Immobilizer - Right Restrictions Weight Bearing Restrictions Per Provider Order: Yes RLE Weight Bearing Per Provider Order: Weight bearing as tolerated     Mobility  Bed Mobility               General bed mobility comments: in reclienr beofre and after Patient Response: Cooperative  Transfers Overall transfer level: Needs assistance Equipment used: Rolling walker (2 wheels) Transfers: Sit to/from Stand Sit to Stand: Min assist                Ambulation/Gait Ambulation/Gait assistance: Min Chemical engineer (Feet): 30 Feet Assistive device: Rolling walker (2  wheels) Gait Pattern/deviations: Step-to pattern, Shuffle, Decreased stance time - right, Decreased step length - left Gait velocity: decreased     General Gait Details: door and back x 2 with seated rest.   Stairs             Wheelchair Mobility     Tilt Bed Tilt Bed Patient Response: Cooperative  Modified Rankin (Stroke Patients Only)       Balance Overall balance assessment: Needs assistance Sitting-balance support: Bilateral upper extremity supported Sitting balance-Leahy Scale: Good     Standing balance support: Bilateral upper extremity supported, During functional activity, Reliant on assistive device for balance Standing balance-Leahy Scale: Fair Standing balance comment: increased assist with transitions but once up to RW is improved                            Communication Communication Communication: No apparent difficulties  Cognition Arousal: Alert Behavior During Therapy: WFL for tasks assessed/performed, Anxious   PT - Cognitive impairments: No apparent impairments                       PT - Cognition Comments: Pt is A and O x 4 Following commands: Intact      Cueing Cueing Techniques: Verbal cues, Tactile cues  Exercises      General Comments        Pertinent Vitals/Pain Pain Assessment Pain Assessment: Faces Faces Pain Scale: Hurts little more Pain Location: R hip, increased with all movement Pain Descriptors / Indicators: Aching,  Discomfort, Grimacing, Guarding Pain Intervention(s): Repositioned, Limited activity within patient's tolerance, Monitored during session    Home Living                          Prior Function            PT Goals (current goals can now be found in the care plan section) Progress towards PT goals: Progressing toward goals    Frequency    BID      PT Plan      Co-evaluation              AM-PAC PT 6 Clicks Mobility   Outcome Measure  Help needed  turning from your back to your side while in a flat bed without using bedrails?: A Little Help needed moving from lying on your back to sitting on the side of a flat bed without using bedrails?: A Little Help needed moving to and from a bed to a chair (including a wheelchair)?: A Little Help needed standing up from a chair using your arms (e.g., wheelchair or bedside chair)?: A Little Help needed to walk in hospital room?: A Little Help needed climbing 3-5 steps with a railing? : A Lot 6 Click Score: 17    End of Session Equipment Utilized During Treatment: Right knee immobilizer;Gait belt Activity Tolerance: Patient tolerated treatment well Patient left: in chair;with call bell/phone within reach;with chair alarm set Nurse Communication: Mobility status PT Visit Diagnosis: Muscle weakness (generalized) (M62.81);Difficulty in walking, not elsewhere classified (R26.2);Pain Pain - Right/Left: Right Pain - part of body: Hip     Time: 9056-9040 PT Time Calculation (min) (ACUTE ONLY): 16 min  Charges:    $Gait Training: 8-22 mins PT General Charges $$ ACUTE PT VISIT: 1 Visit                   Lauraine Gills, PTA 10/11/24, 10:43 AM

## 2024-10-12 DIAGNOSIS — S72001A Fracture of unspecified part of neck of right femur, initial encounter for closed fracture: Secondary | ICD-10-CM | POA: Diagnosis not present

## 2024-10-12 LAB — HEMOGLOBIN AND HEMATOCRIT, BLOOD
HCT: 29.8 % — ABNORMAL LOW (ref 36.0–46.0)
Hemoglobin: 9.6 g/dL — ABNORMAL LOW (ref 12.0–15.0)

## 2024-10-12 MED ORDER — FE FUM-VIT C-VIT B12-FA 460-60-0.01-1 MG PO CAPS: 1.0000 | ORAL_CAPSULE | Freq: Every day | ORAL | Status: AC

## 2024-10-12 NOTE — Progress Notes (Incomplete)
 PROGRESS NOTE Loretta Ramsey  FMW:[id number] DOB: [birth year] DOA: 10/05/2024 PCP: Cleotilde Oneil FALCON, MD  Chief Complaint  Patient presents with   Hip Pain   Hospital Course:  Loretta Ramsey is a 78 y.o. female with medical history significant of GERD, Depression, urinary incontinence presented with right-sided pain after she slipped and fell on the concrete porch. Admitted for right femoral neck fracture s/p mechanical fall Currently post-op and medically stable for dc to SNF when available.   Subjective: Patient had improved symptoms with standing. No lightheadedness. Feels like she needs to have a BM today.   Objective: Vitals:   10/11/24 0720 10/11/24 1608 10/11/24 2044 10/12/24 0430  BP: (!) 140/55 (!) 126/57 (!) 155/69 (!) 145/64  Pulse: 89 78 87 83  Resp:  17 17 17   Temp:  97.8 F (36.6 C) 98.4 F (36.9 C) 98.7 F (37.1 C)  TempSrc:   Oral Oral  SpO2: 96% 98% 97% 95%  Weight:      Height:        Intake/Output Summary (Last 24 hours) at 10/12/2024 0721 Last data filed at 10/11/2024 2152 Gross per 24 hour  Intake 600 ml  Output 200 ml  Net 400 ml   Filed Weights   10/05/24 1357  Weight: 69.4 kg   Examination: General: Awake, no distress.  CV:  RRR, no murmurs heard, no pedal edema Resp: clear to auscultation bilaterally MSK: Right hip and flank TTP, ROM decreased due to pain, skin abrasion R elbow Neuro: AO x 3, no gross focal deficits.  Assessment & Plan:  Right femoral neck fracture with impaction and varus angulation of the distal fragment- s/p Mechanical Fall - Seen by Orthopedics,s/p Right hip hemiarthroplasty 10/7 - Pain management, Antiemetics prn - PT/OT. Plan to go to SNF  Post-op anemia- hgb 7.9 from 12.5 on presentation Patient had some lightheadedness when standing and transitioning to commode yesterday. Resolved with rest. Given IVF and still had some lightheadedness after standing for a prolonged period today. No LOC. - s/p 1u pRBC yesterday.  Now hgb stsable 9.3  Hypoxia- patient was on 2Lnc without a baseline use. She denies dyspnea. Possibly atelectasis post-op or some pulmonary contusion from fall. Doubt related to incidental finding on chest imaging. She has now been able to wean to room air without dyspnea.  - continue to use incentive spirometer   Elevated BP- Not on meds at home. Low diastolics.  - Monitor BP    GERD - PPI   Depression - Wellbutrin, Trazodone   Incidental findings - 1.2 cm diameter ground glass nodule in RUL.  Follow-up with CT in 6 months  DVT prophylaxis: SCD   Code Status: Full Code Disposition:  TBD  Consultants:  Treatment Team:  Consulting Physician: Maryrose Cordella Charleston, MD  Procedures:  R hip hemiarthroplasty  Data Reviewed: I have personally reviewed following labs and imaging studies CBC: Recent Labs  Lab 10/05/24 1405 10/06/24 0405 10/07/24 0423 10/09/24 0816 10/10/24 0242 10/10/24 1929 10/11/24 0332  WBC 11.0* 8.3 9.5 8.5 7.2  --  7.1  NEUTROABS 7.7  --   --   --   --   --   --   HGB 12.5 11.3* 10.3* 8.8* 7.9* 9.6* 9.3*  HCT 38.6 34.5* 32.0* 27.9* 24.1* 29.0* 28.3*  MCV 106.9* 105.2* 104.9* 107.7* 106.2*  --  101.1*  PLT 273 212 214 243 223  --  282   Basic Metabolic Panel: Recent Labs  Lab 10/05/24 1405 10/06/24 0405  10/07/24 0423  NA 136 137 137  K 4.2 3.9 4.4  CL 98 101 102  CO2 24 27 28   GLUCOSE 92 111* 130*  BUN 25* 21 17  CREATININE 0.89 0.68 0.57  CALCIUM 9.5 8.7* 8.9    LOS: 7 days  MDM: Patient is high risk for one or more organ failure.  They necessitate ongoing hospitalization for continued IV therapies and subsequent lab monitoring. Total time spent interpreting labs and vitals, reviewing the medical record, coordinating care amongst consultants and care team members, directly assessing and discussing care with the patient and/or family: 35 min  Marien LITTIE Piety, MD [facility]  To contact the attending physician between 7A-7P  please use Epic Chat. To contact the covering physician during after hours 7P-7A, please review Amion.  10/12/2024, 7:21 AM

## 2024-10-12 NOTE — Progress Notes (Signed)
 Physical Therapy Treatment Patient Details Name: Loretta Ramsey MRN: [id number] DOB: 01-14-1946 Today's Date: 10/12/2024   History of Present Illness 78 y/o female s/p fall with R hip fracture and subsequent total hip (posterior approach) 10/9.    PT Comments  Pt in chair, ready for gait.  KI is adjusted prior to gait as it has slid down.  She stands with min a x 1 to RW and self selects increased gait distances today of 12' then 54' after seated rest.  Motivated to increased distances and proud of her progress.  Fatigued with activity but able to participate with OT after gait.   If plan is discharge home, recommend the following: A little help with walking and/or transfers;A little help with bathing/dressing/bathroom;Help with stairs or ramp for entrance;Assist for transportation;Assistance with cooking/housework   Can travel by private vehicle        Equipment Recommendations  None recommended by PT    Recommendations for Other Services       Precautions / Restrictions Precautions Precautions: Posterior Hip;Fall Precaution Booklet Issued: Yes (comment) Recall of Precautions/Restrictions: Intact Precaution/Restrictions Comments: pt still needing reminders to recall all 3/3 precuations Required Braces or Orthoses: Knee Immobilizer - Right Restrictions Weight Bearing Restrictions Per Provider Order: Yes RLE Weight Bearing Per Provider Order: Weight bearing as tolerated     Mobility  Bed Mobility               General bed mobility comments: in reclienr beofre and after Patient Response: Cooperative  Transfers Overall transfer level: Needs assistance Equipment used: Rolling walker (2 wheels) Transfers: Sit to/from Stand Sit to Stand: Min assist           General transfer comment: struggles some with transitions to standing but is improving.    Ambulation/Gait Ambulation/Gait assistance: Min assist, Contact guard assist Gait Distance (Feet): 80  Feet Assistive device: Rolling walker (2 wheels) Gait Pattern/deviations: Step-to pattern, Shuffle, Decreased stance time - right, Decreased step length - left Gait velocity: decreased     General Gait Details: 75' then 55' after seated rest   Stairs             Wheelchair Mobility     Tilt Bed Tilt Bed Patient Response: Cooperative  Modified Rankin (Stroke Patients Only)       Balance Overall balance assessment: Needs assistance Sitting-balance support: Bilateral upper extremity supported Sitting balance-Leahy Scale: Good     Standing balance support: Bilateral upper extremity supported, During functional activity, Reliant on assistive device for balance Standing balance-Leahy Scale: Fair Standing balance comment: increased assist with transitions but once up to RW is improved                            Communication Communication Communication: No apparent difficulties  Cognition Arousal: Alert Behavior During Therapy: WFL for tasks assessed/performed, Anxious   PT - Cognitive impairments: No apparent impairments                       PT - Cognition Comments: Pt is A and O x 4 Following commands: Intact      Cueing Cueing Techniques: Verbal cues, Tactile cues  Exercises      General Comments        Pertinent Vitals/Pain Pain Assessment Pain Assessment: Faces Faces Pain Scale: Hurts a little bit Pain Location: R hip, increased with all movement Pain Descriptors / Indicators: Aching, Discomfort, Grimacing, Guarding  Pain Intervention(s): Repositioned, Monitored during session, Limited activity within patient's tolerance    Home Living                          Prior Function            PT Goals (current goals can now be found in the care plan section) Progress towards PT goals: Progressing toward goals    Frequency    BID      PT Plan      Co-evaluation              AM-PAC PT 6 Clicks  Mobility   Outcome Measure  Help needed turning from your back to your side while in a flat bed without using bedrails?: A Little Help needed moving from lying on your back to sitting on the side of a flat bed without using bedrails?: A Little Help needed moving to and from a bed to a chair (including a wheelchair)?: A Little Help needed standing up from a chair using your arms (e.g., wheelchair or bedside chair)?: A Little Help needed to walk in hospital room?: A Little Help needed climbing 3-5 steps with a railing? : A Lot 6 Click Score: 17    End of Session Equipment Utilized During Treatment: Right knee immobilizer;Gait belt Activity Tolerance: Patient tolerated treatment well Patient left: in chair;with call bell/phone within reach;with chair alarm set;with family/visitor present Nurse Communication: Mobility status PT Visit Diagnosis: Muscle weakness (generalized) (M62.81);Difficulty in walking, not elsewhere classified (R26.2);Pain Pain - Right/Left: Right Pain - part of body: Hip     Time: 9053-8998 PT Time Calculation (min) (ACUTE ONLY): 15 min  Charges:    $Gait Training: 8-22 mins PT General Charges $$ ACUTE PT VISIT: 1 Visit                   Lauraine Gills, PTA 10/12/24, 10:43 AM

## 2024-10-12 NOTE — Progress Notes (Signed)
 Physical Therapy Treatment Patient Details Name: Loretta Ramsey MRN: [id number] DOB: [birth year] Today's Date: 10/12/2024   History of Present Illness 78 y/o female s/p fall with R hip fracture and subsequent total hip (posterior approach) 10/9.    PT Comments  Pt in chair.  Ready for gait.  Stands and is able to increase gait distance to 110' with RW and cga/min a x 1 for transitions.  KI donned during session per pt comfort.  Will trial gait tomorrow with KI off as orders are for KI when not in therapy.  She is encouraged to try more but stated she walked with nursing earlier and will walk again with nursing staff later as she is generally frustrated over discharge delay and wanting to speak to her family.  Encouragement given.     If plan is discharge home, recommend the following: A little help with walking and/or transfers;A little help with bathing/dressing/bathroom;Help with stairs or ramp for entrance;Assist for transportation;Assistance with cooking/housework   Can travel by private vehicle        Equipment Recommendations  None recommended by PT    Recommendations for Other Services       Precautions / Restrictions Precautions Precautions: Posterior Hip;Fall Precaution Booklet Issued: Yes (comment) Recall of Precautions/Restrictions: Impaired Precaution/Restrictions Comments: recalls 1/3 precaution, continue to reinforce throughout Required Braces or Orthoses: Knee Immobilizer - Right Knee Immobilizer - Right: Other (comment);On at all times (except on therapies) Restrictions Weight Bearing Restrictions Per Provider Order: Yes RLE Weight Bearing Per Provider Order: Weight bearing as tolerated     Mobility  Bed Mobility               General bed mobility comments: NT, pt recieved and left in recliner Patient Response: Cooperative  Transfers Overall transfer level: Needs assistance Equipment used: Rolling walker (2 wheels) Transfers: Sit to/from Stand Sit to  Stand: Min assist           General transfer comment: requires cues to offset weight from RLE, cues for hand positioning, minA to rise from recliner    Ambulation/Gait Ambulation/Gait assistance: Min assist, Contact guard assist Gait Distance (Feet): 110 Feet Assistive device: Rolling walker (2 wheels) Gait Pattern/deviations: Step-to pattern, Shuffle, Decreased stance time - right, Decreased step length - left Gait velocity: decreased     General Gait Details: 24' then 96' after seated rest   Stairs             Wheelchair Mobility     Tilt Bed Tilt Bed Patient Response: Cooperative  Modified Rankin (Stroke Patients Only)       Balance Overall balance assessment: Needs assistance Sitting-balance support: Bilateral upper extremity supported Sitting balance-Leahy Scale: Good     Standing balance support: Bilateral upper extremity supported, During functional activity, Reliant on assistive device for balance Standing balance-Leahy Scale: Fair Standing balance comment: increased assist with transitions but once up to RW is improved                            Communication Communication Communication: No apparent difficulties  Cognition Arousal: Alert Behavior During Therapy: WFL for tasks assessed/performed, Anxious, Impulsive   PT - Cognitive impairments: No apparent impairments                       PT - Cognition Comments: Pt is A and O x 4 Following commands: Intact      Cueing Cueing Techniques:  Verbal cues, Tactile cues  Exercises      General Comments        Pertinent Vitals/Pain Pain Assessment Pain Assessment: Faces Faces Pain Scale: Hurts a little bit Pain Location: R hip increased with transitions Pain Descriptors / Indicators: Aching, Discomfort, Grimacing, Guarding Pain Intervention(s): Limited activity within patient's tolerance, Monitored during session, Repositioned    Home Living                           Prior Function            PT Goals (current goals can now be found in the care plan section) Progress towards PT goals: Progressing toward goals    Frequency    BID      PT Plan      Co-evaluation              AM-PAC PT 6 Clicks Mobility   Outcome Measure  Help needed turning from your back to your side while in a flat bed without using bedrails?: A Little Help needed moving from lying on your back to sitting on the side of a flat bed without using bedrails?: A Little Help needed moving to and from a bed to a chair (including a wheelchair)?: A Little Help needed standing up from a chair using your arms (e.g., wheelchair or bedside chair)?: A Little Help needed to walk in hospital room?: A Little Help needed climbing 3-5 steps with a railing? : A Lot 6 Click Score: 17    End of Session Equipment Utilized During Treatment: Right knee immobilizer;Gait belt Activity Tolerance: Patient tolerated treatment well Patient left: in chair;with call bell/phone within reach;with chair alarm set;with family/visitor present Nurse Communication: Mobility status PT Visit Diagnosis: Muscle weakness (generalized) (M62.81);Difficulty in walking, not elsewhere classified (R26.2);Pain Pain - Right/Left: Right Pain - part of body: Hip     Time: 0253-0308 PT Time Calculation (min) (ACUTE ONLY): 15 min  Charges:    $Gait Training: 8-22 mins PT General Charges $$ ACUTE PT VISIT: 1 Visit                   Lauraine Gills, PTA 10/12/24, 3:19 PM

## 2024-10-12 NOTE — Progress Notes (Addendum)
  Subjective: 6 Days Post-Op Procedure(s) (LRB): HEMIARTHROPLASTY (BIPOLAR) HIP, POSTERIOR APPROACH FOR FRACTURE (Right) Patient doing well this am. Ambulating well with PT. Asymptomatic.  Patient is well, and has had no acute complaints or problems Plan is to go Rehab  Negative for chest pain and shortness of breath Fever: no + BM   Objective: Vital signs in last 24 hours: Temp:  [97.8 F (36.6 C)-98.7 F (37.1 C)] 98.7 F (37.1 C) (10/13 0430) Pulse Rate:  [78-89] 83 (10/13 0430) Resp:  [17] 17 (10/13 0430) BP: (126-155)/(50-69) 145/64 (10/13 0430) SpO2:  [95 %-98 %] 95 % (10/13 0430)  Intake/Output from previous day:  Intake/Output Summary (Last 24 hours) at 10/12/2024 0705 Last data filed at 10/11/2024 2152 Gross per 24 hour  Intake 600 ml  Output 200 ml  Net 400 ml    Intake/Output this shift: No intake/output data recorded.  Labs: Recent Labs    10/09/24 0816 10/10/24 0242 10/10/24 1929 10/11/24 0332  HGB 8.8* 7.9* 9.6* 9.3*   Recent Labs    10/10/24 0242 10/10/24 1929 10/11/24 0332  WBC 7.2  --  7.1  RBC 2.27*  --  2.80*  HCT 24.1* 29.0* 28.3*  PLT 223  --  282   No results for input(s): NA, K, CL, CO2, BUN, CREATININE, GLUCOSE, CALCIUM in the last 72 hours.  No results for input(s): LABPT, INR in the last 72 hours.   EXAM General - Patient is Alert and Oriented Extremity - Neurovascular intact Sensation intact distally Dorsiflexion/Plantar flexion intact Compartment soft With the knee immobilizer in place Dressing/Incision - clean, dry, mild drainage noted at the distal portion to patients honeycomb Motor Function - intact, moving foot and toes well on exam.   Past Medical History:  Diagnosis Date   Arteriovenous malformation of liver    B12 deficiency    Cervical disc disease    Depression    Hypertension    Osteoarthritis     Assessment/Plan: 6 Days Post-Op Procedure(s) (LRB): HEMIARTHROPLASTY (BIPOLAR) HIP,  POSTERIOR APPROACH FOR FRACTURE (Right) Principal Problem:   Closed displaced fracture of right femoral neck (HCC)  Estimated body mass index is 24.69 kg/m as calculated from the following:   Height as of this encounter: 5' 6 (1.676 m).   Weight as of this encounter: 69.4 kg. Advance diet Up with therapy Pain well controlled VSS Acute post op blood loss anemia - Hgb 9.3 on 10/11/2024, s/p 1 unit PRBC 10/10/24. New hgb and hct ordered. Continue with Fe supplementation.  Incision cleaned with topical alcohol and new honeycomb dressing applied  Discharge planning: Dressing changes as needed.  No showering.  Follow-up at [facility] clinic orthopedics for x-rays and staple removal in 2 weeks.  Posterior hip precautions  Orthopedics is signing off and will follow from afar. Please feel free to consult if any other questions or concerns  DVT Prophylaxis - Lovenox and TED hose Weight-Bearing as tolerated to right leg  Fonda CHARLENA Koyanagi, PA-C [facility] 10/12/2024, 7:05 AM

## 2024-10-12 NOTE — Progress Notes (Signed)
 Occupational Therapy Treatment Patient Details Name: Loretta Ramsey MRN: [id number] DOB: 04-02-1946 Today's Date: 10/12/2024   History of present illness 78 y/o female s/p fall with R hip fracture and subsequent total hip (posterior approach) 10/9.   OT comments  Pt seen for OT treatment session this date. KI donned throughout session, pt recalls 1/3 posterior hip precautions and requires cues to reinforce throughout. Pt able to perform STS and functional transfers with MIN A using RW +2, pt impulsive and attempting to stand without physical assist or AD, requires cues for redirection. MAX A for standing pericare. Pt making good progress towards goals, denies pain or dizziness throughout. OT will continue to follow. Discharge recommendation appropriate.       If plan is discharge home, recommend the following:  A lot of help with walking and/or transfers;A lot of help with bathing/dressing/bathroom;Assistance with cooking/housework;Help with stairs or ramp for entrance;Assist for transportation   Equipment Recommendations  Other (comment)       Precautions / Restrictions Precautions Precautions: Posterior Hip;Fall Precaution Booklet Issued: Yes (comment) Recall of Precautions/Restrictions: Impaired Precaution/Restrictions Comments: recalls 1/3 precaution, continue to reinforce throughout Required Braces or Orthoses: Knee Immobilizer - Right Knee Immobilizer - Right: On at all times Restrictions Weight Bearing Restrictions Per Provider Order: Yes RLE Weight Bearing Per Provider Order: Weight bearing as tolerated       Mobility Bed Mobility Overal bed mobility: Needs Assistance             General bed mobility comments: NT, pt recieved and left in recliner    Transfers Overall transfer level: Needs assistance Equipment used: Rolling walker (2 wheels) Transfers: Sit to/from Stand, Bed to chair/wheelchair/BSC Sit to Stand: Min assist     Step pivot transfers: Min  assist     General transfer comment: requires cues to offset weight from RLE, cues for hand positioning, minA to rise from recliner     Balance Overall balance assessment: Needs assistance Sitting-balance support: Bilateral upper extremity supported Sitting balance-Leahy Scale: Good     Standing balance support: Bilateral upper extremity supported, During functional activity, Reliant on assistive device for balance Standing balance-Leahy Scale: Fair                             ADL either performed or assessed with clinical judgement   ADL Overall ADL's : Needs assistance/impaired     Grooming: Wash/dry hands;Wash/dry face;Sitting;Set up Grooming Details (indicate cue type and reason): recliner level                 Toilet Transfer: Minimal assistance;BSC/3in1;Rolling walker (2 wheels) Toilet Transfer Details (indicate cue type and reason): minA for cues and technique, pt attempting to perform transfer without RW. Toileting- Clothing Manipulation and Hygiene: Maximal assistance;Sit to/from stand       Functional mobility during ADLs: Minimal assistance;Contact guard assist;Rolling walker (2 wheels) General ADL Comments: recliner t/f BSC. max A for pericare     Communication Communication Communication: No apparent difficulties   Cognition Arousal: Alert Behavior During Therapy: WFL for tasks assessed/performed, Anxious, Impulsive Cognition: Cognition impaired     Awareness: Intellectual awareness impaired Memory impairment (select all impairments): Short-term memory Attention impairment (select first level of impairment): Focused attention Executive functioning impairment (select all impairments): Problem solving, Reasoning OT - Cognition Comments: impulsive, requires cues to redirect from 2x attempts to sponanously stand from recliner without AD/CGA  Following commands: Intact        Cueing   Cueing Techniques: Verbal cues,  Tactile cues             Pertinent Vitals/ Pain       Pain Assessment Pain Assessment: No/denies pain Pain Score: 0-No pain   Frequency  Min 2X/week        Progress Toward Goals  OT Goals(current goals can now be found in the care plan section)  Progress towards OT goals: Progressing toward goals  Acute Rehab OT Goals OT Goal Formulation: With patient/family Time For Goal Achievement: 10/21/24 Potential to Achieve Goals: Fair ADL Goals Pt Will Perform Grooming: sitting;with modified independence Pt Will Perform Lower Body Dressing: sit to/from stand;with adaptive equipment;with contact guard assist Pt Will Transfer to Toilet: with min assist;ambulating Pt Will Perform Toileting - Clothing Manipulation and hygiene: with min assist;sit to/from stand  Plan         AM-PAC OT 6 Clicks Daily Activity     Outcome Measure   Help from another person eating meals?: None Help from another person taking care of personal grooming?: A Little Help from another person toileting, which includes using toliet, bedpan, or urinal?: Total Help from another person bathing (including washing, rinsing, drying)?: A Lot Help from another person to put on and taking off regular upper body clothing?: A Little Help from another person to put on and taking off regular lower body clothing?: Total 6 Click Score: 14    End of Session Equipment Utilized During Treatment: Gait belt;Rolling walker (2 wheels)  OT Visit Diagnosis: Unsteadiness on feet (R26.81);Repeated falls (R29.6);Muscle weakness (generalized) (M62.81)   Activity Tolerance Patient tolerated treatment well   Patient Left in chair;with call bell/phone within reach;with chair alarm set;with family/visitor present   Nurse Communication Mobility status        Time: 1000-1015 OT Time Calculation (min): 15 min  Charges: OT General Charges $OT Visit: 1 Visit OT Treatments $Self Care/Home Management : 8-22 mins  Bannon Giammarco L.  Gurvir Schrom, OTR/L  10/12/24, 2:47 PM

## 2024-10-12 NOTE — Discharge Summary (Signed)
 Physician Discharge Summary  Patient: Loretta Ramsey FMW:[id number] DOB: January 04, 1946   Code Status: Full Code Admit date: 10/05/2024 Discharge date: 10/12/2024 Disposition: Skilled nursing facility, PT, OT, nurse aid, and RN PCP: Cleotilde Oneil FALCON, MD  Recommendations for Outpatient Follow-up:  Follow up with PCP within 1-2 weeks Regarding general hospital follow up and preventative care Recommend CBC. Hgb 9.6 on day of dc Follow up: - 1.2 cm diameter ground glass nodule in RUL.  Follow-up with CT in 6 months Follow up with ortho surgery 2 weeks for staple removal and xray  Discharge Diagnoses:  Principal Problem:   Closed displaced fracture of right femoral neck [facility])  Brief Hospital Course Summary: Loretta Ramsey is a 78 y.o. female with medical history significant of GERD, Depression, urinary incontinence presented with right-sided pain after she slipped and fell on the concrete porch. Admitted for right femoral neck fracture s/p mechanical fall. Ortho was consulted and completed right hip hemiarthroplasty 10/06/2024. Post-op recovery was complicated by mild anemia which required a blood transfusion on 10/11. Hgb has remained stable since without significant bleeding.   Right femoral neck fracture with impaction and varus angulation of the distal fragment- s/p Mechanical Fall - Seen by Orthopedics,s/p Right hip hemiarthroplasty 10/7 - Pain management, Antiemetics prn - PT/OT. Plan to go to SNF   Post-op anemia- hgb 7.9 from 12.5 on presentation Patient had some lightheadedness when standing and transitioning to commode. Resolved with rest. Given IVF and still had some lightheadedness after standing for a prolonged period. No LOC. - s/p 1u pRBC. Now hgb stable. Symptoms have resolved.    Hypoxia- patient was on 2Lnc post-op without a baseline use. She denies dyspnea. Possibly atelectasis post-op or some pulmonary contusion from fall. Doubt related to incidental finding on chest  imaging. She has now been able to wean to room air without dyspnea.  - continue to use incentive spirometer   Elevated BP- Not on meds at home. Low diastolics.  - Monitor BP    GERD - PPI   Depression - Wellbutrin, Trazodone   Incidental findings - 1.2 cm diameter ground glass nodule in RUL.  Follow-up with CT in 6 months  All other chronic conditions were treated with home medications.    Discharge Condition: Good, improved Recommended discharge diet: Regular healthy diet  Consultations: Ortho surgery   Procedures/Studies: ORIF  Allergies as of 10/12/2024       Reactions   Ace Inhibitors Cough   cough   Codeine Nausea Only   Hydrocodone Nausea Only        Medication List     PAUSE taking these medications    bumetanide 0.5 MG tablet Wait to take this until your doctor or other care provider tells you to start again. Commonly known as: BUMEX Take 0.5 mg by mouth daily.       TAKE these medications    acetaminophen 500 MG tablet Commonly known as: TYLENOL Take 500 mg by mouth 3 (three) times daily as needed for mild pain (pain score 1-3).   ascorbic acid 500 MG tablet Commonly known as: VITAMIN C Take 500 mg by mouth daily.   buPROPion 300 MG 24 hr tablet Commonly known as: WELLBUTRIN XL Take 300 mg by mouth daily.   cyanocobalamin  1000 MCG tablet Commonly known as: VITAMIN B12 Take 1,000 mcg by mouth daily.   enoxaparin 40 MG/0.4ML injection Commonly known as: LOVENOX Inject 0.4 mLs (40 mg total) into the skin daily for 28 days.  Fe Fum-Vit C-Vit B12-FA Caps capsule Commonly known as: TRIGELS-F FORTE Take 1 capsule by mouth daily after breakfast. Start taking on: October 13, 2024   meloxicam 7.5 MG tablet Commonly known as: MOBIC Take 7.5 mg by mouth daily as needed for pain.   oxyCODONE 5 MG immediate release tablet Commonly known as: Oxy IR/ROXICODONE Take 0.5 tablets (2.5 mg total) by mouth every 4 (four) hours as needed for  moderate pain (pain score 4-6).   pantoprazole 40 MG tablet Commonly known as: PROTONIX Take 40 mg by mouth daily.   polyethylene glycol 17 g packet Commonly known as: MIRALAX / GLYCOLAX Take 17 g by mouth daily.   tolterodine 2 MG tablet Commonly known as: DETROL Take 4 mg by mouth daily.   traZODone 50 MG tablet Commonly known as: DESYREL Take 50 mg by mouth at bedtime.        Follow-up Information     Verlinda Boas, PA-C Follow up in 2 week(s).   Specialty: Orthopedic Surgery Why: Staple removal and x-rays of the right hip Contact information: [address] [place] [address] [phone]                 Subjective   Pt reports feeling well today. Doing well with PT. Denies pain at rest.   All questions and concerns were addressed at time of discharge.  Objective  Blood pressure (!) 141/57, pulse 79, temperature 98.5 F (36.9 C), resp. rate 17, height 5' 6 (1.676 m), weight 69.4 kg, SpO2 99%.   General: Pt is alert, awake, not in acute distress Cardiovascular: RRR, S1/S2 +, no rubs, no gallops Respiratory: CTA bilaterally, no wheezing, no rhonchi Abdominal: Soft, NT, ND, bowel sounds + Extremities: no edema, no cyanosis. Surgical incision healing well.   The results of significant diagnostics from this hospitalization (including imaging, microbiology, ancillary and laboratory) are listed below for reference.   Imaging studies: DG Pelvis Portable Result Date: 10/06/2024 CLINICAL DATA:  Status post hemiarthroplasty EXAM: PORTABLE PELVIS 1-2 VIEWS COMPARISON:  Vertebrae 0 graph FINDINGS: Right hip hemiarthroplasty in expected alignment. No periprosthetic lucency or fracture. Recent postsurgical change includes air and edema in the soft tissues. Overlying skin staples in place. IMPRESSION: Right hip hemiarthroplasty without immediate postoperative complication. Electronically Signed   By: Andrea Gasman M.D.   On: 10/06/2024  17:22   DG Pelvis Portable Result Date: 10/06/2024 CLINICAL DATA:  Elective surgery.  Surgery, portable. EXAM: PORTABLE PELVIS 1-2 VIEWS COMPARISON:  None Available. FINDINGS: Cross-table view of the pelvis submitted from the operating room. Image obtained during right hip arthroplasty. IMPRESSION: Intraoperative radiograph during right hip arthroplasty. Electronically Signed   By: Andrea Gasman M.D.   On: 10/06/2024 17:21   CT Cervical Spine Wo Contrast Result Date: 10/05/2024 CLINICAL DATA:  Neck trauma (Age >= 65y) Slip and fall. EXAM: CT CERVICAL SPINE WITHOUT CONTRAST TECHNIQUE: Multidetector CT imaging of the cervical spine was performed without intravenous contrast. Multiplanar CT image reconstructions were also generated. RADIATION DOSE REDUCTION: This exam was performed according to the departmental dose-optimization program which includes automated exposure control, adjustment of the mA and/or kV according to patient size and/or use of iterative reconstruction technique. COMPARISON:  None Available. FINDINGS: Alignment: Straightening and broad-based reversal of normal lordosis. Degenerative grade 1 anterolisthesis of C2 on C3 and C3 on C4. No traumatic subluxation. Skull base and vertebrae: No acute fracture. Vertebral body heights are maintained. The dens and skull base are intact. Soft tissues and  spinal canal: No prevertebral fluid or swelling. No visible canal hematoma. Disc levels: Degenerative disc disease C4-C5 through C7-T1. Multilevel facet hypertrophy. Ligamentum flavum calcification posteriorly at C6 level. Combination of disc disease and posterior calcification causes narrowing of the spinal canal. Upper chest: Assessed on concurrent chest CT, reported separately. Other: None. IMPRESSION: 1. No acute fracture or traumatic subluxation of the cervical spine. 2. Multilevel degenerative disc disease and facet hypertrophy. Electronically Signed   By: Andrea Gasman M.D.   On: 10/05/2024  16:46   CT CHEST ABDOMEN PELVIS W CONTRAST Result Date: 10/05/2024 CLINICAL DATA:  Poly trauma, blunt. Right hip pain after slip and fall injury. EXAM: CT CHEST, ABDOMEN, AND PELVIS WITH CONTRAST TECHNIQUE: Multidetector CT imaging of the chest, abdomen and pelvis was performed following the standard protocol during bolus administration of intravenous contrast. RADIATION DOSE REDUCTION: This exam was performed according to the departmental dose-optimization program which includes automated exposure control, adjustment of the mA and/or kV according to patient size and/or use of iterative reconstruction technique. CONTRAST:  OMNIPAQUE  IOHEXOL  300 MG/ML  SOLN COMPARISON:  MRI abdomen 04/14/2019. CT abdomen and pelvis 04/13/2019. FINDINGS: CT CHEST FINDINGS Cardiovascular: No significant vascular findings. Normal heart size. No pericardial effusion. Mediastinum/Nodes: No enlarged mediastinal, hilar, or axillary lymph nodes. Thyroid  gland, trachea, and esophagus demonstrate no significant findings. Lungs/Pleura: Scattered emphysematous changes in the lungs. Scarring in the lung apices. 1.2 cm diameter ground-glass nodule in the right upper lung, series 4, image 95. This area was not included in the prior studies for comparison purposes. No other focal pulmonary nodules. No pleural effusion or pneumothorax. Musculoskeletal: Degenerative changes in the spine. No vertebral compression deformities. Ribs and sternum are nondepressed. CT ABDOMEN PELVIS FINDINGS Hepatobiliary: Multiple scattered low-attenuation liver lesions are unchanged since prior study, likely cyst. Focal hyperenhancing lesion seen previously in the right lobe of the liver is not identified today, likely due to differences in bolus timing. Gallbladder and bile ducts are normal. Pancreas: Unremarkable. No pancreatic ductal dilatation or surrounding inflammatory changes. Spleen: Normal in size without focal abnormality. Adrenals/Urinary Tract: No  adrenal gland nodules. Numerous bilateral renal cysts, largest measuring up to about 2.5 cm diameter. No change since prior study. No imaging follow-up is indicated. Nephrograms are otherwise homogeneous. No hydronephrosis or hydroureter. Bladder is normal. Stomach/Bowel: Stomach, small bowel, and colon are not abnormally distended. No wall thickening or inflammatory stranding. Stool fills the colon. Appendix is not identified. Vascular/Lymphatic: Aortic atherosclerosis. No enlarged abdominal or pelvic lymph nodes. Reproductive: Status post hysterectomy. No adnexal masses. Other: No abdominal wall hernia or abnormality. No abdominopelvic ascites. Musculoskeletal: Degenerative changes in the lumbar spine. No vertebral compression deformities. Transverse fracture of the right femoral neck extending to the base of the femoral neck but without obvious inter trochanteric involvement. Varus angulation. Sacrum and pelvis appear intact. IMPRESSION: 1. Acute transverse fracture of the right femoral neck without inter trochanteric involvement. 2. Otherwise, no acute posttraumatic changes demonstrated in the chest, abdomen, or pelvis. 3. 1.2 cm diameter ground-glass nodule in the right upper lung. Initial follow-up with CT at 6 months is recommended to confirm persistence. If persistent, repeat CT is recommended every 2 years until 5 years of stability has been established. This recommendation follows the consensus statement: Guidelines for Management of Incidental Pulmonary Nodules Detected on CT Images: From the Fleischner Society 2017; Radiology 2017; 284:228-243. 4. Additional incidental findings as above. Electronically Signed   By: Elsie Gravely M.D.   On: 10/05/2024 16:45   CT  Head Wo Contrast Result Date: 10/05/2024 CLINICAL DATA:  Head trauma, minor (Age >= 65y) Slip and fall. EXAM: CT HEAD WITHOUT CONTRAST TECHNIQUE: Contiguous axial images were obtained from the base of the skull through the vertex without  intravenous contrast. RADIATION DOSE REDUCTION: This exam was performed according to the departmental dose-optimization program which includes automated exposure control, adjustment of the mA and/or kV according to patient size and/or use of iterative reconstruction technique. COMPARISON:  Head CT 06/20/2018 FINDINGS: Brain: No intracranial hemorrhage, mass effect, or midline shift. Generalized atrophy. No hydrocephalus. The basilar cisterns are patent. Symmetric basal gangliar mineralization. Periventricular chronic small vessel ischemia. No evidence of territorial infarct or acute ischemia. No extra-axial or intracranial fluid collection. Vascular: No hyperdense vessel or unexpected calcification. Skull: No fracture or focal lesion. Sinuses/Orbits: Paranasal sinuses and mastoid air cells are clear. The visualized orbits are unremarkable. Bilateral cataract resection. Other: No confluent scalp hematoma. IMPRESSION: 1. No acute intracranial abnormality. No skull fracture. 2. Generalized atrophy and chronic small vessel ischemia. Electronically Signed   By: Andrea Gasman M.D.   On: 10/05/2024 16:41   DG Elbow Complete Right Result Date: 10/05/2024 CLINICAL DATA:  Left elbow pain and skin tear following a fall. EXAM: RIGHT ELBOW - COMPLETE 3+ VIEW COMPARISON:  None Available. FINDINGS: There is no evidence of fracture, dislocation, or joint effusion. There is no evidence of arthropathy or other focal bone abnormality. Soft tissues are unremarkable. IMPRESSION: Negative. Electronically Signed   By: Elspeth Bathe M.D.   On: 10/05/2024 15:11   DG Hip Unilat  With Pelvis 2-3 Views Right Result Date: 10/05/2024 EXAM: 2 OR MORE VIEW(S) XRAY OF THE RIGHT HIP 10/05/2024 02:42:51 PM COMPARISON: CT of 4 / 13 / 20 CLINICAL HISTORY: fall. Fall, FINDINGS: BONES AND JOINTS: Acute right femoral neck fracture with impaction and varus angulation of the distal fracture fragment. The hip joint is maintained. No significant  degenerative changes. SOFT TISSUES: The soft tissues are unremarkable. IMPRESSION: 1. Acute right femoral neck fracture with impaction and varus angulation of the distal fragment. Electronically signed by: Rockey Kilts MD 10/05/2024 03:10 PM EDT RP Workstation: [id number]    Labs: Basic Metabolic Panel: Recent Labs  Lab 10/05/24 1405 10/06/24 0405 10/07/24 0423  NA 136 137 137  K 4.2 3.9 4.4  CL 98 101 102  CO2 24 27 28   GLUCOSE 92 111* 130*  BUN 25* 21 17  CREATININE 0.89 0.68 0.57  CALCIUM 9.5 8.7* 8.9   CBC: Recent Labs  Lab 10/05/24 1405 10/06/24 0405 10/07/24 0423 10/09/24 0816 10/10/24 0242 10/10/24 1929 10/11/24 0332 10/12/24 0747  WBC 11.0* 8.3 9.5 8.5 7.2  --  7.1  --   NEUTROABS 7.7  --   --   --   --   --   --   --   HGB 12.5 11.3* 10.3* 8.8* 7.9* 9.6* 9.3* 9.6*  HCT 38.6 34.5* 32.0* 27.9* 24.1* 29.0* 28.3* 29.8*  MCV 106.9* 105.2* 104.9* 107.7* 106.2*  --  101.1*  --   PLT 273 212 214 243 223  --  282  --    Microbiology: No results found for this or any previous visit.  Time coordinating discharge: Over 30 minutes  Marien LITTIE Piety, MD  [facility] 10/12/2024, 12:58 PM

## 2024-10-12 NOTE — Plan of Care (Signed)
  Problem: Education: Goal: Knowledge of General Education information will improve Description: Including pain rating scale, medication(s)/side effects and non-pharmacologic comfort measures Outcome: Progressing   Problem: Clinical Measurements: Goal: Ability to maintain clinical measurements within normal limits will improve Outcome: Progressing   Problem: Activity: Goal: Risk for activity intolerance will decrease Outcome: Progressing   Problem: Pain Managment: Goal: General experience of comfort will improve and/or be controlled Outcome: Progressing   Problem: Education: Goal: Knowledge of the prescribed therapeutic regimen will improve Outcome: Progressing   Problem: Activity: Goal: Ability to avoid complications of mobility impairment will improve Outcome: Progressing   Problem: Clinical Measurements: Goal: Postoperative complications will be avoided or minimized Outcome: Progressing   Problem: Pain Management: Goal: Pain level will decrease with appropriate interventions Outcome: Progressing   Problem: Skin Integrity: Goal: Will show signs of wound healing Outcome: Progressing

## 2024-10-12 NOTE — Plan of Care (Signed)

## 2024-10-12 NOTE — TOC Progression Note (Addendum)
 Transition of Care [facility]) - Progression Note    Patient Details  Name: Loretta Ramsey MRN: [id number] Date of Birth: 1946-12-21  [facility] CM/SW Contact  Marinda Cooks, RN Phone Number: 10/12/2024, 12:23 PM  Clinical Narrative:    This CM spoke with pt's daughter Loretta Ramsey and confirmed SNF 1st choice is [facility] . This CM called and spoke with Admission Liaison Darrian and confirmed bed offer and that pt can be rec'd today, call placed to  [facility] nurse with pt's ins HTA to inform to start ins auth and was sent to VM. Detailed message left informing of ins auth needing to be started for pt with this CM call back info. TOC will cont to follow dc planning / care coordination and update as applicable.   13:46p-This CM rec'd call back from [facility]  nurse with pt's HTA  ins and provided pt's info she started ins auth for [place] [facility]. TOC will cont to follow dc planning / care coordination and update as applicable.   Expected Discharge Plan and Services  SNF for rehab     Social Drivers of Health (SDOH) Interventions SDOH Screenings   Food Insecurity: Patient Declined (10/06/2024)  Housing: Patient Declined (10/06/2024)  Transportation Needs: No Transportation Needs (10/07/2024)  Utilities: Patient Declined (10/06/2024)  Financial Resource Strain: Low Risk  (09/28/2024)   Received from [facility]  Social Connections: Unknown (10/06/2024)  Tobacco Use: Medium Risk (10/06/2024)    Readmission Risk Interventions     No data to display

## 2024-10-13 DIAGNOSIS — K5901 Slow transit constipation: Secondary | ICD-10-CM | POA: Diagnosis not present

## 2024-10-13 DIAGNOSIS — Z9181 History of falling: Secondary | ICD-10-CM | POA: Diagnosis not present

## 2024-10-13 DIAGNOSIS — K219 Gastro-esophageal reflux disease without esophagitis: Secondary | ICD-10-CM | POA: Diagnosis not present

## 2024-10-13 DIAGNOSIS — S79929A Unspecified injury of unspecified thigh, initial encounter: Secondary | ICD-10-CM | POA: Diagnosis not present

## 2024-10-13 DIAGNOSIS — S72001D Fracture of unspecified part of neck of right femur, subsequent encounter for closed fracture with routine healing: Secondary | ICD-10-CM | POA: Diagnosis not present

## 2024-10-13 DIAGNOSIS — R609 Edema, unspecified: Secondary | ICD-10-CM | POA: Diagnosis not present

## 2024-10-13 DIAGNOSIS — D62 Acute posthemorrhagic anemia: Secondary | ICD-10-CM | POA: Diagnosis not present

## 2024-10-13 DIAGNOSIS — K2100 Gastro-esophageal reflux disease with esophagitis, without bleeding: Secondary | ICD-10-CM | POA: Diagnosis not present

## 2024-10-13 DIAGNOSIS — F32A Depression, unspecified: Secondary | ICD-10-CM | POA: Diagnosis not present

## 2024-10-13 DIAGNOSIS — S72141D Displaced intertrochanteric fracture of right femur, subsequent encounter for closed fracture with routine healing: Secondary | ICD-10-CM | POA: Diagnosis not present

## 2024-10-13 DIAGNOSIS — M6281 Muscle weakness (generalized): Secondary | ICD-10-CM | POA: Diagnosis not present

## 2024-10-13 DIAGNOSIS — S72001A Fracture of unspecified part of neck of right femur, initial encounter for closed fracture: Secondary | ICD-10-CM | POA: Diagnosis not present

## 2024-10-13 DIAGNOSIS — R2689 Other abnormalities of gait and mobility: Secondary | ICD-10-CM | POA: Diagnosis not present

## 2024-10-13 DIAGNOSIS — Z7401 Bed confinement status: Secondary | ICD-10-CM | POA: Diagnosis not present

## 2024-10-13 DIAGNOSIS — S72009D Fracture of unspecified part of neck of unspecified femur, subsequent encounter for closed fracture with routine healing: Secondary | ICD-10-CM | POA: Diagnosis not present

## 2024-10-13 DIAGNOSIS — Z96649 Presence of unspecified artificial hip joint: Secondary | ICD-10-CM | POA: Diagnosis not present

## 2024-10-13 DIAGNOSIS — I10 Essential (primary) hypertension: Secondary | ICD-10-CM | POA: Diagnosis not present

## 2024-10-13 DIAGNOSIS — D500 Iron deficiency anemia secondary to blood loss (chronic): Secondary | ICD-10-CM | POA: Diagnosis not present

## 2024-10-13 DIAGNOSIS — R911 Solitary pulmonary nodule: Secondary | ICD-10-CM | POA: Diagnosis not present

## 2024-10-13 DIAGNOSIS — F339 Major depressive disorder, recurrent, unspecified: Secondary | ICD-10-CM | POA: Diagnosis not present

## 2024-10-13 DIAGNOSIS — R2681 Unsteadiness on feet: Secondary | ICD-10-CM | POA: Diagnosis not present

## 2024-10-13 NOTE — Progress Notes (Signed)
 Nurse attempted to call report to [facility]. No answer and not able to leave a voicemail at this time.

## 2024-10-13 NOTE — TOC Transition Note (Signed)
 Transition of Care [facility]) - Discharge Note   Patient Details  Name: Loretta Ramsey MRN: [id number] Date of Birth: 04/29/1946  Transition of Care [facility]) CM/SW Contact:  Alvaro Louder, LCSW Phone Number: 10/13/2024, 3:57 PM   Clinical Narrative:     LCSWA received insurance approval for patient to admit to SNF [facility]. Electronics engineer Approval Number [id number]) LCSWA confirmed with MD that patient is stable for discharge. LCSWA notified the patient and they are in agreement with discharge. LCSWA confirmed bed is available at SNF. Transport arranged with [facility] for next available.  [phone] RM [phone]       Patient Goals and CMS Choice            Discharge Placement                       Discharge Plan and Services Additional resources added to the After Visit Summary for                                       Social Drivers of Health (SDOH) Interventions SDOH Screenings   Food Insecurity: Patient Declined (10/06/2024)  Housing: Patient Declined (10/06/2024)  Transportation Needs: No Transportation Needs (10/07/2024)  Utilities: Patient Declined (10/06/2024)  Financial Resource Strain: Low Risk  (09/28/2024)   Received from [facility]  Social Connections: Unknown (10/06/2024)  Tobacco Use: Medium Risk (10/06/2024)     Readmission Risk Interventions     No data to display

## 2024-10-13 NOTE — Plan of Care (Signed)
  Problem: Education: Goal: Knowledge of General Education information will improve Description: Including pain rating scale, medication(s)/side effects and non-pharmacologic comfort measures 10/13/2024 0335 by Tomie Jalaine Leonie JAYSON, RN Outcome: Progressing 10/13/2024 0335 by Tomie Jalaine Leonie JAYSON, RN Outcome: Progressing   Problem: Education: Goal: Knowledge of the prescribed therapeutic regimen will improve Outcome: Progressing   Problem: Activity: Goal: Ability to avoid complications of mobility impairment will improve Outcome: Progressing Goal: Ability to tolerate increased activity will improve Outcome: Progressing   Problem: Clinical Measurements: Goal: Postoperative complications will be avoided or minimized Outcome: Progressing   Problem: Pain Management: Goal: Pain level will decrease with appropriate interventions Outcome: Progressing   Problem: Skin Integrity: Goal: Will show signs of wound healing Outcome: Progressing

## 2024-10-13 NOTE — Plan of Care (Signed)
  Problem: Education: Goal: Knowledge of General Education information will improve Description: Including pain rating scale, medication(s)/side effects and non-pharmacologic comfort measures Outcome: Adequate for Discharge   Problem: Health Behavior/Discharge Planning: Goal: Ability to manage health-related needs will improve Outcome: Adequate for Discharge   Problem: Clinical Measurements: Goal: Ability to maintain clinical measurements within normal limits will improve Outcome: Adequate for Discharge Goal: Will remain free from infection Outcome: Adequate for Discharge Goal: Diagnostic test results will improve Outcome: Adequate for Discharge Goal: Respiratory complications will improve Outcome: Adequate for Discharge Goal: Cardiovascular complication will be avoided Outcome: Adequate for Discharge   Problem: Activity: Goal: Risk for activity intolerance will decrease Outcome: Adequate for Discharge   Problem: Nutrition: Goal: Adequate nutrition will be maintained Outcome: Adequate for Discharge   Problem: Coping: Goal: Level of anxiety will decrease Outcome: Adequate for Discharge   Problem: Elimination: Goal: Will not experience complications related to bowel motility Outcome: Adequate for Discharge Goal: Will not experience complications related to urinary retention Outcome: Adequate for Discharge   Problem: Pain Managment: Goal: General experience of comfort will improve and/or be controlled Outcome: Adequate for Discharge   Problem: Safety: Goal: Ability to remain free from injury will improve Outcome: Adequate for Discharge   Problem: Skin Integrity: Goal: Risk for impaired skin integrity will decrease Outcome: Adequate for Discharge   Problem: Education: Goal: Knowledge of the prescribed therapeutic regimen will improve Outcome: Adequate for Discharge Goal: Understanding of discharge needs will improve Outcome: Adequate for Discharge Goal:  Individualized Educational Video(s) Outcome: Adequate for Discharge   Problem: Activity: Goal: Ability to avoid complications of mobility impairment will improve Outcome: Adequate for Discharge Goal: Ability to tolerate increased activity will improve Outcome: Adequate for Discharge   Problem: Clinical Measurements: Goal: Postoperative complications will be avoided or minimized Outcome: Adequate for Discharge   Problem: Pain Management: Goal: Pain level will decrease with appropriate interventions Outcome: Adequate for Discharge   Problem: Skin Integrity: Goal: Will show signs of wound healing Outcome: Adequate for Discharge

## 2024-10-13 NOTE — Progress Notes (Signed)
 10/13/2024 1:14 PM -----------------------------------------------------------[facility]--------------------------------------------------- D(Data) A(Action) R(response)     Data: Discharge Readiness Assessment EDD listed tomorrow 10/14/2024    Action: Chart reviewed    Response: Pending insurance authorization for [facility]. Auth. Started 10/12/2024 by ICM     Kami Kube, RN [facility]

## 2024-10-13 NOTE — Discharge Summary (Signed)
 Physician Discharge Summary  Patient: Loretta Ramsey FMW:[id number] DOB: [birth year]   Code Status: Full Code Admit date: 10/05/2024 Discharge date: 10/13/2024 Disposition: Skilled nursing facility, PT, OT, nurse aid, and RN PCP: Cleotilde Oneil FALCON, MD  Recommendations for Outpatient Follow-up:  Follow up with PCP within 1-2 weeks Regarding general hospital follow up and preventative care Recommend CBC. Hgb 9.6 on day of dc Follow up: - 1.2 cm diameter ground glass nodule in RUL.  Follow-up with CT in 6 months Follow up with ortho surgery 2 weeks for staple removal and xray  Discharge Diagnoses:  Principal Problem:   Closed displaced fracture of right femoral neck [facility])  Brief Hospital Course Summary: Loretta Ramsey is a 78 y.o. female with medical history significant of GERD, Depression, urinary incontinence presented with right-sided pain after she slipped and fell on the concrete porch. Admitted for right femoral neck fracture s/p mechanical fall. Ortho was consulted and completed right hip hemiarthroplasty 10/06/2024. Post-op recovery was complicated by mild anemia which required a blood transfusion on 10/11. Hgb has remained stable since without significant bleeding.   Right femoral neck fracture with impaction and varus angulation of the distal fragment- s/p Mechanical Fall - Seen by Orthopedics,s/p Right hip hemiarthroplasty 10/7 - Pain management, Antiemetics prn - PT/OT. Plan to go to SNF   Post-op anemia- hgb 7.9 from 12.5 on presentation Patient had some lightheadedness when standing and transitioning to commode. Resolved with rest. Given IVF and still had some lightheadedness after standing for a prolonged period. No LOC. - s/p 1u pRBC. Now hgb stable. Symptoms have resolved.    Hypoxia- patient was on 2Lnc post-op without a baseline use. She denies dyspnea. Possibly atelectasis post-op or some pulmonary contusion from fall. Doubt related to incidental finding on chest  imaging. She has now been able to wean to room air without dyspnea.  - continue to use incentive spirometer   Elevated BP- Not on meds at home. Low diastolics.  - Monitor BP    GERD - PPI   Depression - Wellbutrin, Trazodone   Incidental findings - 1.2 cm diameter ground glass nodule in RUL.  Follow-up with CT in 6 months  All other chronic conditions were treated with home medications.    Discharge Condition: Good, improved Recommended discharge diet: Regular healthy diet  Consultations: Ortho surgery   Procedures/Studies: ORIF  Allergies as of 10/13/2024       Reactions   Ace Inhibitors Cough   cough   Codeine Nausea Only   Hydrocodone Nausea Only        Medication List     PAUSE taking these medications    bumetanide 0.5 MG tablet Wait to take this until your doctor or other care provider tells you to start again. Commonly known as: BUMEX Take 0.5 mg by mouth daily.       TAKE these medications    acetaminophen 500 MG tablet Commonly known as: TYLENOL Take 500 mg by mouth 3 (three) times daily as needed for mild pain (pain score 1-3).   ascorbic acid 500 MG tablet Commonly known as: VITAMIN C Take 500 mg by mouth daily.   buPROPion 300 MG 24 hr tablet Commonly known as: WELLBUTRIN XL Take 300 mg by mouth daily.   cyanocobalamin  1000 MCG tablet Commonly known as: VITAMIN B12 Take 1,000 mcg by mouth daily.   enoxaparin 40 MG/0.4ML injection Commonly known as: LOVENOX Inject 0.4 mLs (40 mg total) into the skin daily for 28 days.  Fe Fum-Vit C-Vit B12-FA Caps capsule Commonly known as: TRIGELS-F FORTE Take 1 capsule by mouth daily after breakfast.   meloxicam 7.5 MG tablet Commonly known as: MOBIC Take 7.5 mg by mouth daily as needed for pain.   oxyCODONE 5 MG immediate release tablet Commonly known as: Oxy IR/ROXICODONE Take 0.5 tablets (2.5 mg total) by mouth every 4 (four) hours as needed for moderate pain (pain score 4-6).    pantoprazole 40 MG tablet Commonly known as: PROTONIX Take 40 mg by mouth daily.   polyethylene glycol 17 g packet Commonly known as: MIRALAX / GLYCOLAX Take 17 g by mouth daily.   tolterodine 2 MG tablet Commonly known as: DETROL Take 4 mg by mouth daily.   traZODone 50 MG tablet Commonly known as: DESYREL Take 50 mg by mouth at bedtime.        Follow-up Information     Verlinda Boas, PA-C Follow up on 10/26/2024.   Specialty: Orthopedic Surgery Why: Staple removal and x-rays of the right hipat 1030a Contact information: [address] [place] [address] [phone]                 Subjective   Pt reports feeling well today. Doing well with PT. Denies pain at rest.   All questions and concerns were addressed at time of discharge.  Objective  Blood pressure (!) 141/57, pulse 79, temperature 98.5 F (36.9 C), resp. rate 17, height 5' 6 (1.676 m), weight 69.4 kg, SpO2 99%.   General: Pt is alert, awake, not in acute distress Cardiovascular: RRR, S1/S2 +, no rubs, no gallops Respiratory: CTA bilaterally, no wheezing, no rhonchi Abdominal: Soft, NT, ND, bowel sounds + Extremities: no edema, no cyanosis. Surgical incision healing well.   The results of significant diagnostics from this hospitalization (including imaging, microbiology, ancillary and laboratory) are listed below for reference.   Imaging studies: DG Pelvis Portable Result Date: 10/06/2024 CLINICAL DATA:  Status post hemiarthroplasty EXAM: PORTABLE PELVIS 1-2 VIEWS COMPARISON:  Vertebrae 0 graph FINDINGS: Right hip hemiarthroplasty in expected alignment. No periprosthetic lucency or fracture. Recent postsurgical change includes air and edema in the soft tissues. Overlying skin staples in place. IMPRESSION: Right hip hemiarthroplasty without immediate postoperative complication. Electronically Signed   By: Andrea Gasman M.D.   On: 10/06/2024 17:22   DG Pelvis  Portable Result Date: 10/06/2024 CLINICAL DATA:  Elective surgery.  Surgery, portable. EXAM: PORTABLE PELVIS 1-2 VIEWS COMPARISON:  None Available. FINDINGS: Cross-table view of the pelvis submitted from the operating room. Image obtained during right hip arthroplasty. IMPRESSION: Intraoperative radiograph during right hip arthroplasty. Electronically Signed   By: Andrea Gasman M.D.   On: 10/06/2024 17:21   CT Cervical Spine Wo Contrast Result Date: 10/05/2024 CLINICAL DATA:  Neck trauma (Age >= 65y) Slip and fall. EXAM: CT CERVICAL SPINE WITHOUT CONTRAST TECHNIQUE: Multidetector CT imaging of the cervical spine was performed without intravenous contrast. Multiplanar CT image reconstructions were also generated. RADIATION DOSE REDUCTION: This exam was performed according to the departmental dose-optimization program which includes automated exposure control, adjustment of the mA and/or kV according to patient size and/or use of iterative reconstruction technique. COMPARISON:  None Available. FINDINGS: Alignment: Straightening and broad-based reversal of normal lordosis. Degenerative grade 1 anterolisthesis of C2 on C3 and C3 on C4. No traumatic subluxation. Skull base and vertebrae: No acute fracture. Vertebral body heights are maintained. The dens and skull base are intact. Soft tissues and spinal canal: No prevertebral fluid or  swelling. No visible canal hematoma. Disc levels: Degenerative disc disease C4-C5 through C7-T1. Multilevel facet hypertrophy. Ligamentum flavum calcification posteriorly at C6 level. Combination of disc disease and posterior calcification causes narrowing of the spinal canal. Upper chest: Assessed on concurrent chest CT, reported separately. Other: None. IMPRESSION: 1. No acute fracture or traumatic subluxation of the cervical spine. 2. Multilevel degenerative disc disease and facet hypertrophy. Electronically Signed   By: Andrea Gasman M.D.   On: 10/05/2024 16:46   CT CHEST  ABDOMEN PELVIS W CONTRAST Result Date: 10/05/2024 CLINICAL DATA:  Poly trauma, blunt. Right hip pain after slip and fall injury. EXAM: CT CHEST, ABDOMEN, AND PELVIS WITH CONTRAST TECHNIQUE: Multidetector CT imaging of the chest, abdomen and pelvis was performed following the standard protocol during bolus administration of intravenous contrast. RADIATION DOSE REDUCTION: This exam was performed according to the departmental dose-optimization program which includes automated exposure control, adjustment of the mA and/or kV according to patient size and/or use of iterative reconstruction technique. CONTRAST:  OMNIPAQUE  IOHEXOL  300 MG/ML  SOLN COMPARISON:  MRI abdomen 04/14/2019. CT abdomen and pelvis 04/13/2019. FINDINGS: CT CHEST FINDINGS Cardiovascular: No significant vascular findings. Normal heart size. No pericardial effusion. Mediastinum/Nodes: No enlarged mediastinal, hilar, or axillary lymph nodes. Thyroid  gland, trachea, and esophagus demonstrate no significant findings. Lungs/Pleura: Scattered emphysematous changes in the lungs. Scarring in the lung apices. 1.2 cm diameter ground-glass nodule in the right upper lung, series 4, image 95. This area was not included in the prior studies for comparison purposes. No other focal pulmonary nodules. No pleural effusion or pneumothorax. Musculoskeletal: Degenerative changes in the spine. No vertebral compression deformities. Ribs and sternum are nondepressed. CT ABDOMEN PELVIS FINDINGS Hepatobiliary: Multiple scattered low-attenuation liver lesions are unchanged since prior study, likely cyst. Focal hyperenhancing lesion seen previously in the right lobe of the liver is not identified today, likely due to differences in bolus timing. Gallbladder and bile ducts are normal. Pancreas: Unremarkable. No pancreatic ductal dilatation or surrounding inflammatory changes. Spleen: Normal in size without focal abnormality. Adrenals/Urinary Tract: No adrenal gland nodules.  Numerous bilateral renal cysts, largest measuring up to about 2.5 cm diameter. No change since prior study. No imaging follow-up is indicated. Nephrograms are otherwise homogeneous. No hydronephrosis or hydroureter. Bladder is normal. Stomach/Bowel: Stomach, small bowel, and colon are not abnormally distended. No wall thickening or inflammatory stranding. Stool fills the colon. Appendix is not identified. Vascular/Lymphatic: Aortic atherosclerosis. No enlarged abdominal or pelvic lymph nodes. Reproductive: Status post hysterectomy. No adnexal masses. Other: No abdominal wall hernia or abnormality. No abdominopelvic ascites. Musculoskeletal: Degenerative changes in the lumbar spine. No vertebral compression deformities. Transverse fracture of the right femoral neck extending to the base of the femoral neck but without obvious inter trochanteric involvement. Varus angulation. Sacrum and pelvis appear intact. IMPRESSION: 1. Acute transverse fracture of the right femoral neck without inter trochanteric involvement. 2. Otherwise, no acute posttraumatic changes demonstrated in the chest, abdomen, or pelvis. 3. 1.2 cm diameter ground-glass nodule in the right upper lung. Initial follow-up with CT at 6 months is recommended to confirm persistence. If persistent, repeat CT is recommended every 2 years until 5 years of stability has been established. This recommendation follows the consensus statement: Guidelines for Management of Incidental Pulmonary Nodules Detected on CT Images: From the Fleischner Society 2017; Radiology 2017; 284:228-243. 4. Additional incidental findings as above. Electronically Signed   By: Elsie Gravely M.D.   On: 10/05/2024 16:45   CT Head Wo Contrast Result Date: 10/05/2024  CLINICAL DATA:  Head trauma, minor (Age >= 65y) Slip and fall. EXAM: CT HEAD WITHOUT CONTRAST TECHNIQUE: Contiguous axial images were obtained from the base of the skull through the vertex without intravenous contrast.  RADIATION DOSE REDUCTION: This exam was performed according to the departmental dose-optimization program which includes automated exposure control, adjustment of the mA and/or kV according to patient size and/or use of iterative reconstruction technique. COMPARISON:  Head CT 06/20/2018 FINDINGS: Brain: No intracranial hemorrhage, mass effect, or midline shift. Generalized atrophy. No hydrocephalus. The basilar cisterns are patent. Symmetric basal gangliar mineralization. Periventricular chronic small vessel ischemia. No evidence of territorial infarct or acute ischemia. No extra-axial or intracranial fluid collection. Vascular: No hyperdense vessel or unexpected calcification. Skull: No fracture or focal lesion. Sinuses/Orbits: Paranasal sinuses and mastoid air cells are clear. The visualized orbits are unremarkable. Bilateral cataract resection. Other: No confluent scalp hematoma. IMPRESSION: 1. No acute intracranial abnormality. No skull fracture. 2. Generalized atrophy and chronic small vessel ischemia. Electronically Signed   By: Andrea Gasman M.D.   On: 10/05/2024 16:41   DG Elbow Complete Right Result Date: 10/05/2024 CLINICAL DATA:  Left elbow pain and skin tear following a fall. EXAM: RIGHT ELBOW - COMPLETE 3+ VIEW COMPARISON:  None Available. FINDINGS: There is no evidence of fracture, dislocation, or joint effusion. There is no evidence of arthropathy or other focal bone abnormality. Soft tissues are unremarkable. IMPRESSION: Negative. Electronically Signed   By: Elspeth Bathe M.D.   On: 10/05/2024 15:11   DG Hip Unilat  With Pelvis 2-3 Views Right Result Date: 10/05/2024 EXAM: 2 OR MORE VIEW(S) XRAY OF THE RIGHT HIP 10/05/2024 02:42:51 PM COMPARISON: CT of 4 / 13 / 20 CLINICAL HISTORY: fall. Fall, FINDINGS: BONES AND JOINTS: Acute right femoral neck fracture with impaction and varus angulation of the distal fracture fragment. The hip joint is maintained. No significant degenerative changes. SOFT  TISSUES: The soft tissues are unremarkable. IMPRESSION: 1. Acute right femoral neck fracture with impaction and varus angulation of the distal fragment. Electronically signed by: Rockey Kilts MD 10/05/2024 03:10 PM EDT RP Workstation: [id number]    Labs: Basic Metabolic Panel: Recent Labs  Lab 10/07/24 0423  NA 137  K 4.4  CL 102  CO2 28  GLUCOSE 130*  BUN 17  CREATININE 0.57  CALCIUM 8.9   CBC: Recent Labs  Lab 10/07/24 0423 10/09/24 0816 10/10/24 0242 10/10/24 1929 10/11/24 0332 10/12/24 0747  WBC 9.5 8.5 7.2  --  7.1  --   HGB 10.3* 8.8* 7.9* 9.6* 9.3* 9.6*  HCT 32.0* 27.9* 24.1* 29.0* 28.3* 29.8*  MCV 104.9* 107.7* 106.2*  --  101.1*  --   PLT 214 243 223  --  282  --    Microbiology: No results found for this or any previous visit.  Time coordinating discharge: Over 30 minutes  Marien LITTIE Piety, MD  [facility] 10/13/2024, 7:41 AM

## 2024-10-13 NOTE — Progress Notes (Signed)
 Occupational Therapy Treatment Patient Details Name: Loretta Ramsey MRN: [id number] DOB: Jul 26, 1946 Today's Date: 10/13/2024   History of present illness 78 y/o female s/p fall with R hip fracture and subsequent total hip (posterior approach) 10/9.   OT comments  Patient seen for OT treatment on this date. Upon arrival to room patient sitting in recliner with daughter and RN present, agreeable to treatment. Focus of treatment was on improving standing tolerance needed to complete ADLs, patient agreeable. Patient performed sit<>stand from recliner with SBA with rolling walker and ambulated to sink; she tolerated standing at sink with SBA/supervision for 11 minutes while she performed grooming (brushing teeth, washing face), UB bathing and partial LB bathing (pericare); CGA provided for LB bathing. OT reviewed posterior hip precautions, she was able to recall 2/3, cues to maintain all hip precautions.  Patient ended treatment in recliner with chair alarm on and all needs within reach. Daughter and grand daughter present in room upon OT departure. Patient making good progress toward goals, will continue to follow POC. Discharge recommendation remains appropriate.        If plan is discharge home, recommend the following:  A lot of help with walking and/or transfers;A lot of help with bathing/dressing/bathroom;Assistance with cooking/housework;Help with stairs or ramp for entrance;Assist for transportation   Equipment Recommendations  Other (comment) (defer to next venue of care)    Recommendations for Other Services      Precautions / Restrictions Precautions Precautions: Posterior Hip;Fall Recall of Precautions/Restrictions: Impaired Precaution/Restrictions Comments: recalls 2/3 precautions Required Braces or Orthoses: Knee Immobilizer - Right Knee Immobilizer - Right: On at all times;Other (comment) (except with therapy) Restrictions Weight Bearing Restrictions Per Provider Order:  Yes RLE Weight Bearing Per Provider Order: Weight bearing as tolerated       Mobility Bed Mobility                    Transfers Overall transfer level: Needs assistance Equipment used: Rolling walker (2 wheels) Transfers: Sit to/from Stand Sit to Stand: Supervision, Contact guard assist           General transfer comment: no cues needed, SBA provided     Balance Overall balance assessment: Needs assistance Sitting-balance support: Bilateral upper extremity supported Sitting balance-Leahy Scale: Good     Standing balance support: During functional activity Standing balance-Leahy Scale: Good                             ADL either performed or assessed with clinical judgement   ADL Overall ADL's : Needs assistance/impaired     Grooming: Wash/dry hands;Wash/dry face;Oral care;Brushing hair;Supervision/safety;Standing   Upper Body Bathing: Supervision/ safety;Standing   Lower Body Bathing: Moderate assistance   Upper Body Dressing : Supervision/safety   Lower Body Dressing: Moderate assistance               Functional mobility during ADLs: Contact guard assist;Supervision/safety General ADL Comments: engaged in 11 minutes of continuous standing at sink performing grooming, simplified bathing    Extremity/Trunk Assessment Upper Extremity Assessment Upper Extremity Assessment: Overall WFL for tasks assessed            Vision       Perception     Praxis     Communication Communication Communication: No apparent difficulties   Cognition Arousal: Alert Behavior During Therapy: WFL for tasks assessed/performed Cognition: No apparent impairments   Orientation impairments: Person, Place, Time, Situation Awareness: Intellectual awareness  intact, Online awareness intact   Attention impairment (select first level of impairment): Selective attention                     Following commands: Intact        Cueing   Cueing  Techniques: Verbal cues, Tactile cues  Exercises      Shoulder Instructions       General Comments      Pertinent Vitals/ Pain       Pain Assessment Pain Assessment: No/denies pain  Home Living                                          Prior Functioning/Environment              Frequency  Min 2X/week        Progress Toward Goals  OT Goals(current goals can now be found in the care plan section)  Progress towards OT goals: Progressing toward goals  Acute Rehab OT Goals Patient Stated Goal: be able to wash up OT Goal Formulation: With patient/family Time For Goal Achievement: 10/21/24 Potential to Achieve Goals: Good ADL Goals Pt Will Perform Grooming: sitting;with modified independence Pt Will Perform Lower Body Dressing: sit to/from stand;with adaptive equipment;with contact guard assist Pt Will Transfer to Toilet: with min assist;ambulating Pt Will Perform Toileting - Clothing Manipulation and hygiene: with min assist;sit to/from stand  Plan      Co-evaluation                 AM-PAC OT 6 Clicks Daily Activity     Outcome Measure   Help from another person eating meals?: None Help from another person taking care of personal grooming?: A Little Help from another person toileting, which includes using toliet, bedpan, or urinal?: A Little Help from another person bathing (including washing, rinsing, drying)?: A Little Help from another person to put on and taking off regular upper body clothing?: A Little Help from another person to put on and taking off regular lower body clothing?: A Little 6 Click Score: 19    End of Session Equipment Utilized During Treatment: Gait belt;Rolling walker (2 wheels)  OT Visit Diagnosis: Unsteadiness on feet (R26.81);Repeated falls (R29.6);Muscle weakness (generalized) (M62.81)   Activity Tolerance Patient tolerated treatment well   Patient Left in chair;with call bell/phone within reach;with  family/visitor present   Nurse Communication          Time: 8885-8862 OT Time Calculation (min): 23 min  Charges: OT General Charges $OT Visit: 1 Visit OT Treatments $Self Care/Home Management : 23-37 mins  Rogers Clause, OT/L [facility], 10/13/2024

## 2024-10-13 NOTE — Progress Notes (Addendum)
 Physical Therapy Treatment Patient Details Name: Loretta Ramsey MRN: [id number] DOB: 1946/05/15 Today's Date: 10/13/2024   History of Present Illness 78 y/o female s/p fall with R hip fracture and subsequent total hip (posterior approach) 10/9.    PT Comments  Pt seen again this am per daughter request to do stairs.  She is taken to gym for up/down 4 steps with bilateral rails to simulate home.  She needs cues for hand placements and sequencing.  She walks to/from bathroom with RW to void and small BM and needs assist for care.    Pt asks about KI and reasons she needs to wear it.  Reviewed with pt that surgeon ordered KI to be worn at all times except in therapies and reasons why it was ordered.  Pt stated brace is rubbing in places and is becoming cumbersome for her.  She opts to leave KI off after session.  Will reach out to ortho team to make them aware.  She is encouraged to wear it at minimum at night until cleared by ortho team.   Pt is progressing with mobility.  She continues to need +1 assist at all times for mobility for hand placement, safety and some balance deficits.  SNF remains appropriate for discharge plan as she lives alone with no local supports or +24 hour assist.  She is not at baseline for mobility at this time and remains a fall risk if walking unassisted.  Pt and family continue to want rehab before transition home.  Will add in stair training daily per family request.    If plan is discharge home, recommend the following: A little help with walking and/or transfers;A little help with bathing/dressing/bathroom;Help with stairs or ramp for entrance;Assist for transportation;Assistance with cooking/housework   Can travel by private vehicle        Equipment Recommendations       Recommendations for Other Services       Precautions / Restrictions Precautions Precautions: Posterior Hip;Fall Recall of Precautions/Restrictions: Impaired Required Braces or Orthoses:  Knee Immobilizer - Right Knee Immobilizer - Right: On at all times (except in therapy sessions) Restrictions Weight Bearing Restrictions Per Provider Order: Yes RLE Weight Bearing Per Provider Order: Weight bearing as tolerated     Mobility  Bed Mobility Overal bed mobility: Needs Assistance Bed Mobility: Supine to Sit     Supine to sit: Contact guard, Min assist, Used rails     General bed mobility comments: NT, pt recieved and left in recliner Patient Response: Cooperative  Transfers Overall transfer level: Needs assistance Equipment used: Rolling walker (2 wheels) Transfers: Sit to/from Stand Sit to Stand: Contact guard assist, Min assist           General transfer comment: cues for hand placements    Ambulation/Gait Ambulation/Gait assistance: Min assist, Contact guard assist Gait Distance (Feet): 60 Feet Assistive device: Rolling walker (2 wheels) Gait Pattern/deviations: Step-to pattern, Shuffle, Decreased stance time - right, Decreased step length - left Gait velocity: decreased     General Gait Details: gait wihout KI today - no buckling   Stairs Stairs: Yes Stairs assistance: Min assist Stair Management: Two rails, Step to pattern Number of Stairs: 4 General stair comments: cues for pattern and technique   Wheelchair Mobility     Tilt Bed Tilt Bed Patient Response: Cooperative  Modified Rankin (Stroke Patients Only)       Balance Overall balance assessment: Needs assistance Sitting-balance support: Bilateral upper extremity supported Sitting balance-Leahy Scale: Good  Standing balance support: Bilateral upper extremity supported, During functional activity, Reliant on assistive device for balance Standing balance-Leahy Scale: Fair Standing balance comment: increased assist with transitions but once up to RW is improved                            Communication Communication Communication: No apparent difficulties   Cognition Arousal: Alert Behavior During Therapy: WFL for tasks assessed/performed   PT - Cognitive impairments: No apparent impairments                       PT - Cognition Comments: Pt is A and O x 4 Following commands: Intact      Cueing Cueing Techniques: Verbal cues, Tactile cues  Exercises Other Exercises Other Exercises: to bathroom to void, sink to wash hands    General Comments        Pertinent Vitals/Pain Pain Assessment Pain Assessment: Faces Faces Pain Scale: Hurts a little bit Pain Location: R hip increased with transitions Pain Descriptors / Indicators: Aching, Discomfort, Grimacing, Guarding Pain Intervention(s): Limited activity within patient's tolerance, Monitored during session, Repositioned    Home Living                          Prior Function            PT Goals (current goals can now be found in the care plan section) Progress towards PT goals: Progressing toward goals    Frequency    BID      PT Plan      Co-evaluation              AM-PAC PT 6 Clicks Mobility   Outcome Measure  Help needed turning from your back to your side while in a flat bed without using bedrails?: A Little Help needed moving from lying on your back to sitting on the side of a flat bed without using bedrails?: A Little Help needed moving to and from a bed to a chair (including a wheelchair)?: A Little Help needed standing up from a chair using your arms (e.g., wheelchair or bedside chair)?: A Little Help needed to walk in hospital room?: A Little Help needed climbing 3-5 steps with a railing? : A Little 6 Click Score: 18    End of Session Equipment Utilized During Treatment: Gait belt Activity Tolerance: Patient tolerated treatment well Patient left: in chair;with call bell/phone within reach;with chair alarm set Nurse Communication: Mobility status PT Visit Diagnosis: Muscle weakness (generalized) (M62.81);Difficulty in walking,  not elsewhere classified (R26.2);Pain Pain - Right/Left: Right Pain - part of body: Hip     Time: 0933-1000 PT Time Calculation (min) (ACUTE ONLY): 27 min  Charges:    $Gait Training: 8-22 mins $Therapeutic Activity: 8-22 mins PT General Charges $$ ACUTE PT VISIT: 1 Visit                   Lauraine Gills, PTA 10/13/24, 11:15 AM

## 2024-10-13 NOTE — Progress Notes (Signed)
 Physical Therapy Treatment Patient Details Name: Loretta Ramsey MRN: [id number] DOB: 10/24/1946 Today's Date: 10/13/2024   History of Present Illness 78 y/o female s/p fall with R hip fracture and subsequent total hip (posterior approach) 10/9.    PT Comments  Pt in bed, ready to get up.  Min a to EOB for LE management.  KI is removed for therapy session.  She is able to stand to RW but initiates pulling up on RW and needs cues throughout session for proper hand placement.  She is able to walk to bathroom to void,  min a for self care, then walk 110' with RW and min a x 1.  No buckling without KI.  She takes short rest in chair before walking to sink to wash her hands then completed standing AROM RLE with walker support.  She does ask to sit due to some light dizziness that clears quickly once sitting.  Pt generally fatigued but pleased with her progress today.  KI replaced per orders after session.  No breakdown noted from brace.     If plan is discharge home, recommend the following: A little help with walking and/or transfers;A little help with bathing/dressing/bathroom;Help with stairs or ramp for entrance;Assist for transportation;Assistance with cooking/housework   Can travel by private vehicle        Equipment Recommendations       Recommendations for Other Services       Precautions / Restrictions Precautions Precautions: Posterior Hip;Fall Recall of Precautions/Restrictions: Impaired Required Braces or Orthoses: Knee Immobilizer - Right Knee Immobilizer - Right: On at all times (except in therapy sessions) Restrictions Weight Bearing Restrictions Per Provider Order: Yes RLE Weight Bearing Per Provider Order: Weight bearing as tolerated     Mobility  Bed Mobility Overal bed mobility: Needs Assistance Bed Mobility: Supine to Sit     Supine to sit: Contact guard, Min assist, Used rails       Patient Response: Cooperative  Transfers Overall transfer level: Needs  assistance Equipment used: Rolling walker (2 wheels) Transfers: Sit to/from Stand Sit to Stand: Contact guard assist, Min assist           General transfer comment: cues for hand placements    Ambulation/Gait Ambulation/Gait assistance: Min assist, Contact guard assist Gait Distance (Feet): 110 Feet Assistive device: Rolling walker (2 wheels) Gait Pattern/deviations: Step-to pattern, Shuffle, Decreased stance time - right, Decreased step length - left Gait velocity: decreased     General Gait Details: gait wihout KI today - no buckling   Stairs             Wheelchair Mobility     Tilt Bed Tilt Bed Patient Response: Cooperative  Modified Rankin (Stroke Patients Only)       Balance Overall balance assessment: Needs assistance Sitting-balance support: Bilateral upper extremity supported Sitting balance-Leahy Scale: Good     Standing balance support: Bilateral upper extremity supported, During functional activity, Reliant on assistive device for balance Standing balance-Leahy Scale: Fair                              Hotel manager: No apparent difficulties  Cognition Arousal: Alert Behavior During Therapy: WFL for tasks assessed/performed   PT - Cognitive impairments: No apparent impairments                       PT - Cognition Comments: Pt is A and O x  4 Following commands: Intact      Cueing Cueing Techniques: Verbal cues, Tactile cues  Exercises Other Exercises Other Exercises: to bathroom to void, sink to wash hands and standing AROM RLE with walker support    General Comments        Pertinent Vitals/Pain Pain Assessment Pain Assessment: Faces Faces Pain Scale: Hurts a little bit Pain Location: R hip increased with transitions Pain Descriptors / Indicators: Aching, Discomfort, Grimacing, Guarding Pain Intervention(s): Limited activity within patient's tolerance, Monitored during session,  Repositioned    Home Living                          Prior Function            PT Goals (current goals can now be found in the care plan section) Progress towards PT goals: Progressing toward goals    Frequency    BID      PT Plan      Co-evaluation              AM-PAC PT 6 Clicks Mobility   Outcome Measure  Help needed turning from your back to your side while in a flat bed without using bedrails?: A Little Help needed moving from lying on your back to sitting on the side of a flat bed without using bedrails?: A Little Help needed moving to and from a bed to a chair (including a wheelchair)?: A Little Help needed standing up from a chair using your arms (e.g., wheelchair or bedside chair)?: A Little Help needed to walk in hospital room?: A Little Help needed climbing 3-5 steps with a railing? : A Lot 6 Click Score: 17    End of Session Equipment Utilized During Treatment: Gait belt Activity Tolerance: Patient tolerated treatment well Patient left: in chair;with call bell/phone within reach;with chair alarm set Nurse Communication: Mobility status PT Visit Diagnosis: Muscle weakness (generalized) (M62.81);Difficulty in walking, not elsewhere classified (R26.2);Pain Pain - Right/Left: Right Pain - part of body: Hip     Time: 0821-0845 PT Time Calculation (min) (ACUTE ONLY): 24 min  Charges:    $Gait Training: 8-22 mins $Therapeutic Activity: 8-22 mins PT General Charges $$ ACUTE PT VISIT: 1 Visit                   Lauraine Gills, PTA 10/13/24, 8:56 AM

## 2024-10-14 DIAGNOSIS — S72001A Fracture of unspecified part of neck of right femur, initial encounter for closed fracture: Secondary | ICD-10-CM | POA: Diagnosis not present

## 2024-10-14 DIAGNOSIS — I10 Essential (primary) hypertension: Secondary | ICD-10-CM | POA: Diagnosis not present

## 2024-10-14 DIAGNOSIS — F32A Depression, unspecified: Secondary | ICD-10-CM | POA: Diagnosis not present

## 2024-10-14 DIAGNOSIS — K219 Gastro-esophageal reflux disease without esophagitis: Secondary | ICD-10-CM | POA: Diagnosis not present

## 2024-10-14 DIAGNOSIS — D500 Iron deficiency anemia secondary to blood loss (chronic): Secondary | ICD-10-CM | POA: Diagnosis not present

## 2024-10-14 DIAGNOSIS — R911 Solitary pulmonary nodule: Secondary | ICD-10-CM | POA: Diagnosis not present

## 2024-10-16 DIAGNOSIS — S72001A Fracture of unspecified part of neck of right femur, initial encounter for closed fracture: Secondary | ICD-10-CM | POA: Diagnosis not present

## 2024-10-16 DIAGNOSIS — F32A Depression, unspecified: Secondary | ICD-10-CM | POA: Diagnosis not present

## 2024-10-16 DIAGNOSIS — R911 Solitary pulmonary nodule: Secondary | ICD-10-CM | POA: Diagnosis not present

## 2024-10-16 DIAGNOSIS — D500 Iron deficiency anemia secondary to blood loss (chronic): Secondary | ICD-10-CM | POA: Diagnosis not present

## 2024-10-16 DIAGNOSIS — K219 Gastro-esophageal reflux disease without esophagitis: Secondary | ICD-10-CM | POA: Diagnosis not present

## 2024-10-19 DIAGNOSIS — S72001D Fracture of unspecified part of neck of right femur, subsequent encounter for closed fracture with routine healing: Secondary | ICD-10-CM | POA: Diagnosis not present

## 2024-10-19 DIAGNOSIS — R609 Edema, unspecified: Secondary | ICD-10-CM | POA: Diagnosis not present

## 2024-10-19 DIAGNOSIS — Z96649 Presence of unspecified artificial hip joint: Secondary | ICD-10-CM | POA: Diagnosis not present

## 2024-10-19 DIAGNOSIS — R911 Solitary pulmonary nodule: Secondary | ICD-10-CM | POA: Diagnosis not present

## 2024-10-19 DIAGNOSIS — D62 Acute posthemorrhagic anemia: Secondary | ICD-10-CM | POA: Diagnosis not present

## 2024-10-19 DIAGNOSIS — K2100 Gastro-esophageal reflux disease with esophagitis, without bleeding: Secondary | ICD-10-CM | POA: Diagnosis not present

## 2024-10-19 DIAGNOSIS — F339 Major depressive disorder, recurrent, unspecified: Secondary | ICD-10-CM | POA: Diagnosis not present

## 2024-10-20 DIAGNOSIS — I10 Essential (primary) hypertension: Secondary | ICD-10-CM | POA: Diagnosis not present

## 2024-10-20 DIAGNOSIS — D62 Acute posthemorrhagic anemia: Secondary | ICD-10-CM | POA: Diagnosis not present

## 2024-10-20 DIAGNOSIS — K219 Gastro-esophageal reflux disease without esophagitis: Secondary | ICD-10-CM | POA: Diagnosis not present

## 2024-10-20 DIAGNOSIS — K5901 Slow transit constipation: Secondary | ICD-10-CM | POA: Diagnosis not present

## 2024-10-20 DIAGNOSIS — S72001A Fracture of unspecified part of neck of right femur, initial encounter for closed fracture: Secondary | ICD-10-CM | POA: Diagnosis not present

## 2024-10-20 DIAGNOSIS — F339 Major depressive disorder, recurrent, unspecified: Secondary | ICD-10-CM | POA: Diagnosis not present

## 2024-10-20 DIAGNOSIS — R911 Solitary pulmonary nodule: Secondary | ICD-10-CM | POA: Diagnosis not present

## 2024-10-22 DIAGNOSIS — K5901 Slow transit constipation: Secondary | ICD-10-CM | POA: Diagnosis not present

## 2024-10-22 DIAGNOSIS — I10 Essential (primary) hypertension: Secondary | ICD-10-CM | POA: Diagnosis not present

## 2024-10-22 DIAGNOSIS — D62 Acute posthemorrhagic anemia: Secondary | ICD-10-CM | POA: Diagnosis not present

## 2024-10-22 DIAGNOSIS — S72009D Fracture of unspecified part of neck of unspecified femur, subsequent encounter for closed fracture with routine healing: Secondary | ICD-10-CM | POA: Diagnosis not present

## 2024-10-22 DIAGNOSIS — R609 Edema, unspecified: Secondary | ICD-10-CM | POA: Diagnosis not present

## 2024-10-26 DIAGNOSIS — M25551 Pain in right hip: Secondary | ICD-10-CM | POA: Diagnosis not present

## 2024-10-28 DIAGNOSIS — Q2739 Arteriovenous malformation, other site: Secondary | ICD-10-CM | POA: Diagnosis not present

## 2024-10-28 DIAGNOSIS — G43909 Migraine, unspecified, not intractable, without status migrainosus: Secondary | ICD-10-CM | POA: Diagnosis not present

## 2024-10-28 DIAGNOSIS — Z860101 Personal history of adenomatous and serrated colon polyps: Secondary | ICD-10-CM | POA: Diagnosis not present

## 2024-10-28 DIAGNOSIS — E538 Deficiency of other specified B group vitamins: Secondary | ICD-10-CM | POA: Diagnosis not present

## 2024-10-28 DIAGNOSIS — R609 Edema, unspecified: Secondary | ICD-10-CM | POA: Diagnosis not present

## 2024-10-28 DIAGNOSIS — I10 Essential (primary) hypertension: Secondary | ICD-10-CM | POA: Diagnosis not present

## 2024-10-28 DIAGNOSIS — K5901 Slow transit constipation: Secondary | ICD-10-CM | POA: Diagnosis not present

## 2024-10-28 DIAGNOSIS — S72141D Displaced intertrochanteric fracture of right femur, subsequent encounter for closed fracture with routine healing: Secondary | ICD-10-CM | POA: Diagnosis not present

## 2024-10-28 DIAGNOSIS — S72009D Fracture of unspecified part of neck of unspecified femur, subsequent encounter for closed fracture with routine healing: Secondary | ICD-10-CM | POA: Diagnosis not present

## 2024-10-28 DIAGNOSIS — M5090 Cervical disc disorder, unspecified, unspecified cervical region: Secondary | ICD-10-CM | POA: Diagnosis not present

## 2024-10-28 DIAGNOSIS — Z96641 Presence of right artificial hip joint: Secondary | ICD-10-CM | POA: Diagnosis not present

## 2024-10-28 DIAGNOSIS — F339 Major depressive disorder, recurrent, unspecified: Secondary | ICD-10-CM | POA: Diagnosis not present

## 2024-10-28 DIAGNOSIS — D500 Iron deficiency anemia secondary to blood loss (chronic): Secondary | ICD-10-CM | POA: Diagnosis not present

## 2024-10-28 DIAGNOSIS — K219 Gastro-esophageal reflux disease without esophagitis: Secondary | ICD-10-CM | POA: Diagnosis not present

## 2024-10-28 DIAGNOSIS — Z9181 History of falling: Secondary | ICD-10-CM | POA: Diagnosis not present

## 2024-10-28 DIAGNOSIS — D62 Acute posthemorrhagic anemia: Secondary | ICD-10-CM | POA: Diagnosis not present

## 2024-10-28 DIAGNOSIS — Z791 Long term (current) use of non-steroidal anti-inflammatories (NSAID): Secondary | ICD-10-CM | POA: Diagnosis not present

## 2024-11-10 DIAGNOSIS — D500 Iron deficiency anemia secondary to blood loss (chronic): Secondary | ICD-10-CM | POA: Diagnosis not present

## 2024-11-10 DIAGNOSIS — M818 Other osteoporosis without current pathological fracture: Secondary | ICD-10-CM | POA: Diagnosis not present

## 2024-11-10 DIAGNOSIS — M8588 Other specified disorders of bone density and structure, other site: Secondary | ICD-10-CM | POA: Diagnosis not present

## 2024-11-10 DIAGNOSIS — F331 Major depressive disorder, recurrent, moderate: Secondary | ICD-10-CM | POA: Diagnosis not present

## 2024-11-10 DIAGNOSIS — Z96641 Presence of right artificial hip joint: Secondary | ICD-10-CM | POA: Diagnosis not present

## 2024-11-16 DIAGNOSIS — R7401 Elevation of levels of liver transaminase levels: Secondary | ICD-10-CM | POA: Diagnosis not present

## 2024-11-24 DIAGNOSIS — R42 Dizziness and giddiness: Secondary | ICD-10-CM | POA: Diagnosis not present

## 2024-11-30 DIAGNOSIS — Z96641 Presence of right artificial hip joint: Secondary | ICD-10-CM | POA: Diagnosis not present

## 2024-11-30 DIAGNOSIS — R7401 Elevation of levels of liver transaminase levels: Secondary | ICD-10-CM | POA: Diagnosis not present

## 2024-11-30 DIAGNOSIS — D500 Iron deficiency anemia secondary to blood loss (chronic): Secondary | ICD-10-CM | POA: Diagnosis not present

## 2024-12-08 DIAGNOSIS — M9901 Segmental and somatic dysfunction of cervical region: Secondary | ICD-10-CM | POA: Diagnosis not present

## 2024-12-08 DIAGNOSIS — M9902 Segmental and somatic dysfunction of thoracic region: Secondary | ICD-10-CM | POA: Diagnosis not present

## 2024-12-08 DIAGNOSIS — M5412 Radiculopathy, cervical region: Secondary | ICD-10-CM | POA: Diagnosis not present

## 2024-12-08 DIAGNOSIS — M546 Pain in thoracic spine: Secondary | ICD-10-CM | POA: Diagnosis not present

## 2024-12-11 DIAGNOSIS — M9901 Segmental and somatic dysfunction of cervical region: Secondary | ICD-10-CM | POA: Diagnosis not present

## 2024-12-11 DIAGNOSIS — M9902 Segmental and somatic dysfunction of thoracic region: Secondary | ICD-10-CM | POA: Diagnosis not present

## 2024-12-11 DIAGNOSIS — M5412 Radiculopathy, cervical region: Secondary | ICD-10-CM | POA: Diagnosis not present

## 2024-12-11 DIAGNOSIS — M546 Pain in thoracic spine: Secondary | ICD-10-CM | POA: Diagnosis not present

## 2025-01-26 ENCOUNTER — Other Ambulatory Visit: Payer: Self-pay | Admitting: Internal Medicine

## 2025-01-26 DIAGNOSIS — Z1231 Encounter for screening mammogram for malignant neoplasm of breast: Secondary | ICD-10-CM

## 2025-02-01 ENCOUNTER — Ambulatory Visit
